# Patient Record
Sex: Female | Born: 1970 | Hispanic: No | Marital: Married | Smoking: Former smoker
Health system: Southern US, Community
[De-identification: ages and names within clinical notes are randomized; demographics above are authoritative.]

## PROBLEM LIST (undated history)

## (undated) DIAGNOSIS — F419 Anxiety disorder, unspecified: Secondary | ICD-10-CM

## (undated) DIAGNOSIS — I4891 Unspecified atrial fibrillation: Secondary | ICD-10-CM

## (undated) DIAGNOSIS — E0500 Thyrotoxicosis with diffuse goiter without thyrotoxic crisis or storm: Secondary | ICD-10-CM

## (undated) DIAGNOSIS — E039 Hypothyroidism, unspecified: Secondary | ICD-10-CM

## (undated) HISTORY — DX: Anxiety disorder, unspecified: F41.9

## (undated) HISTORY — DX: Thyrotoxicosis with diffuse goiter without thyrotoxic crisis or storm: E05.00

## (undated) HISTORY — DX: Hypothyroidism, unspecified: E03.9

## (undated) HISTORY — DX: Unspecified atrial fibrillation: I48.91

## (undated) HISTORY — PX: OTHER SURGICAL HISTORY: SHX169

---

## 1998-04-15 ENCOUNTER — Ambulatory Visit (HOSPITAL_BASED_OUTPATIENT_CLINIC_OR_DEPARTMENT_OTHER): Admission: RE | Admit: 1998-04-15 | Discharge: 1998-04-15 | Payer: Self-pay | Admitting: Orthopedic Surgery

## 2002-06-25 ENCOUNTER — Other Ambulatory Visit: Admission: RE | Admit: 2002-06-25 | Discharge: 2002-06-25 | Payer: Self-pay | Admitting: Obstetrics and Gynecology

## 2003-06-27 ENCOUNTER — Other Ambulatory Visit: Admission: RE | Admit: 2003-06-27 | Discharge: 2003-06-27 | Payer: Self-pay | Admitting: Obstetrics and Gynecology

## 2004-06-29 ENCOUNTER — Other Ambulatory Visit: Admission: RE | Admit: 2004-06-29 | Discharge: 2004-06-29 | Payer: Self-pay | Admitting: Obstetrics and Gynecology

## 2005-07-07 ENCOUNTER — Other Ambulatory Visit: Admission: RE | Admit: 2005-07-07 | Discharge: 2005-07-07 | Payer: Self-pay | Admitting: Obstetrics and Gynecology

## 2009-10-30 ENCOUNTER — Encounter (INDEPENDENT_AMBULATORY_CARE_PROVIDER_SITE_OTHER): Payer: Self-pay | Admitting: *Deleted

## 2009-11-11 ENCOUNTER — Encounter: Admission: RE | Admit: 2009-11-11 | Discharge: 2009-11-11 | Payer: Self-pay | Admitting: Obstetrics and Gynecology

## 2009-12-08 ENCOUNTER — Ambulatory Visit: Payer: Self-pay | Admitting: Gastroenterology

## 2009-12-08 DIAGNOSIS — K5909 Other constipation: Secondary | ICD-10-CM | POA: Insufficient documentation

## 2009-12-08 DIAGNOSIS — K625 Hemorrhage of anus and rectum: Secondary | ICD-10-CM

## 2009-12-18 ENCOUNTER — Ambulatory Visit: Payer: Self-pay | Admitting: Gastroenterology

## 2009-12-24 ENCOUNTER — Encounter: Payer: Self-pay | Admitting: Gastroenterology

## 2010-08-24 NOTE — Procedures (Signed)
Summary: Colonoscopy  Patient: Sonya Arellano Note: All result statuses are Final unless otherwise noted.  Tests: (1) Colonoscopy (COL)   COL Colonoscopy           DONE     [facility]     [address]     [place], [place]  [address]           COLONOSCOPY PROCEDURE REPORT           PATIENT:  Sonya Arellano, Sonya Arellano  MR#:  [id number]     BIRTHDATE:  30-Nov-1970, 38 yrs. old  GENDER:  female           ENDOSCOPIST:  Barbette Hair. Arlyce Dice, MD     Referred by:           PROCEDURE DATE:  12/18/2009     PROCEDURE:  Colonoscopy with biopsy     ASA CLASS:  Class I     INDICATIONS:  1) rectal bleeding           MEDICATIONS:   Fentanyl 75 mcg IV, Versed 8 mg IV, Benadryl 25 mg     IV           DESCRIPTION OF PROCEDURE:   After the risks benefits and     alternatives of the procedure were thoroughly explained, informed     consent was obtained.  Digital rectal exam was performed and     revealed no abnormalities.   The LB CF-[id number] endoscope     was introduced through the anus and advanced to the cecum, which     was identified by both the appendix and ileocecal valve, without     limitations.  The quality of the prep was excellent, using     MoviPrep.  The instrument was then slowly withdrawn as the colon     was fully examined.     <<PROCEDUREIMAGES>>           FINDINGS:  mucosal abnormality. Limited area of submucosal     hemorrhagic mucosa (?secondary to prep vs limited procitis)     extending 2cm from anal canal. Bxs taken (see image7).     Diverticula were found in the ascending colon (see image4).  Mild     diverticulosis was found in the sigmoid colon (see image5).  This     was otherwise a normal examination of the colon (see image1,     image2, image3, and image6).   Retroflexed views in the rectum     revealed no abnormalities.    The time to cecum =  3.50  minutes.     The scope was then withdrawn (time =  5.75  min) from the patient     and the procedure completed.       COMPLICATIONS:  None           ENDOSCOPIC IMPRESSION:     1) Mucosal abnormality     2) Diverticula in the ascending colon     3) Mild diverticulosis in the sigmoid colon     4) Otherwise normal examination     RECOMMENDATIONS:     1) high fiber diet     2) call office next 1-3 days to schedule followup visit in 3     weeks     3) miralax if no bm in 3-4 days           REPEAT EXAM:   You will receive a letter from Dr. Arlyce Dice in 1-2  weeks, after reviewing the final pathology, with followup     recommendations.           ______________________________     Barbette Hair Arlyce Dice, MD           CC: Huel Cote, MD           n.     Rosalie Doctor:   Barbette Hair. Kaplan at 12/18/2009 12:24 PM           Irigoyen, Lafonda Mosses, [id number]  Note: An exclamation mark (!) indicates a result that was not dispersed into the flowsheet. Document Creation Date: 12/18/2009 12:25 PM _______________________________________________________________________  (1) Order result status: Final Collection or observation date-time: 12/18/2009 12:17 Requested date-time:  Receipt date-time:  Reported date-time:  Referring Physician:   Ordering Physician: Melvia Heaps [phone]) Specimen Source:  Source: Launa Grill Order Number: [id number] Lab site:

## 2010-08-24 NOTE — Letter (Signed)
Summary: [facility] Instructions  [facility]  [address] [place], [place] [address]   Phone: [phone]  Fax: [phone]       Sonya Arellano    01-27-1971    MRN: [id number]        Procedure Day /Date:FRIDAY 12/18/2009     Arrival Time:10AM     Procedure Time:11AM     Location of Procedure:                    X   [facility] (4th Floor) _  PREPARATION FOR COLONOSCOPY WITH MOVIPREP   Starting 5 days prior to your procedure 12/13/2009 do not eat nuts, seeds, popcorn, corn, beans, peas,  salads, or any raw vegetables.  Do not take any fiber supplements (e.g. Metamucil, Citrucel, and Benefiber).  THE DAY BEFORE YOUR PROCEDURE         DATE: 12/17/2009  FUX:NATFTDDU 1.  Drink clear liquids the entire day-NO SOLID FOOD  2.  Do not drink anything colored red or purple.  Avoid juices with pulp.  No orange juice.  3.  Drink at least 64 oz. (8 glasses) of fluid/clear liquids during the day to prevent dehydration and help the prep work efficiently.  CLEAR LIQUIDS INCLUDE: Water Jello Ice Popsicles Tea (sugar ok, no milk/cream) Powdered fruit flavored drinks Coffee (sugar ok, no milk/cream) Gatorade Juice: apple, white grape, white cranberry  Lemonade Clear bullion, consomm, broth Carbonated beverages (any kind) Strained chicken noodle soup Hard Candy                             4.  In the morning, mix first dose of MoviPrep solution:    Empty 1 Pouch A and 1 Pouch B into the disposable container    Add lukewarm drinking water to the top line of the container. Mix to dissolve    Refrigerate (mixed solution should be used within 24 hrs)  5.  Begin drinking the prep at 5:00 p.m. The MoviPrep container is divided by 4 marks.   Every 15 minutes drink the solution down to the next mark (approximately 8 oz) until the full liter is complete.   6.  Follow completed prep with 16 oz of clear liquid of your choice (Nothing red or purple).  Continue to  drink clear liquids until bedtime.  7.  Before going to bed, mix second dose of MoviPrep solution:    Empty 1 Pouch A and 1 Pouch B into the disposable container    Add lukewarm drinking water to the top line of the container. Mix to dissolve    Refrigerate  THE DAY OF YOUR PROCEDURE      DATE: 12/18/2009 DAY: FRIDAY  Beginning at 6a.m. (5 hours before procedure):         1. Every 15 minutes, drink the solution down to the next mark (approx 8 oz) until the full liter is complete.  2. Follow completed prep with 16 oz. of clear liquid of your choice.    3. You may drink clear liquids until 9AM(2 HOURS BEFORE PROCEDURE).   MEDICATION INSTRUCTIONS  Unless otherwise instructed, you should take regular prescription medications with a small sip of water   as early as possible the morning of your procedure.          OTHER INSTRUCTIONS  You will need a responsible adult at least 42 years of age to accompany you and drive  you home.   This person must remain in the waiting room during your procedure.  Wear loose fitting clothing that is easily removed.  Leave jewelry and other valuables at home.  However, you may wish to bring a book to read or  an iPod/MP3 player to listen to music as you wait for your procedure to start.  Remove all body piercing jewelry and leave at home.  Total time from sign-in until discharge is approximately 2-3 hours.  You should go home directly after your procedure and rest.  You can resume normal activities the  day after your procedure.  The day of your procedure you should not:   Drive   Make legal decisions   Operate machinery   Drink alcohol   Return to work  You will receive specific instructions about eating, activities and medications before you leave.    The above instructions have been reviewed and explained to me by   _______________________    I fully understand and can verbalize these instructions  _____________________________ Date _________

## 2010-08-24 NOTE — Assessment & Plan Note (Signed)
Summary: rectal bleeding--ch.   History of Present Illness Visit Type: consult Primary GI MD: Melvia Heaps MD [facility] Primary Provider: n/a Requesting Provider: Huel Cote, MD Chief Complaint: episode of bowel movement with mucous and blood, pt has also noticed change in bowels History of Present Illness:   Sonya Arellano is a pleasant 40 year old white female referred at the request of Dr. Senaida Ores for evaluation of constipation and rectal bleeding.  She has had constipation for years whereby she will may move her bowels once a week.  Over the past year she developed abdominal bloating, discomfort and occasional pain associated with constipation.  At the same time she has noted decreased caliber of her stools and passage of blood and mucus along with her stools.  The latter has occurred on a couple of occasions.  Weight has been stable.   GI Review of Systems    Reports abdominal pain, bloating, loss of appetite, and  nausea.     Location of  Abdominal pain: upper abdomen.    Denies acid reflux, belching, chest pain, dysphagia with liquids, dysphagia with solids, heartburn, vomiting, vomiting blood, weight loss, and  weight gain.      Reports change in bowel habits, constipation, diarrhea, and  irritable bowel syndrome.     Denies anal fissure, black tarry stools, diverticulosis, fecal incontinence, heme positive stool, hemorrhoids, jaundice, light color stool, liver problems, rectal bleeding, and  rectal pain. Preventive Screening-Counseling & Management  Alcohol-Tobacco     Smoking Status: never      Drug Use:  no.      Current Medications (verified): 1)  None  Allergies (verified): 1)  ! Sulfa  Past History:  Past Medical History: Pelvic Endometriosis Migraines  Anxiety Disorder Urinary Tract Infection  Past Surgical History: Reviewed history from 12/03/2009 and no changes required. C-Section Cryosurgery Laparoscopy  Family History: CVA Family History of  Diabetes: Mat Uncle Family History of Heart Disease: Father-MI, Grandparents (3 deceased), Uncle-deceased Family History of Breast Cancer: MGM No FH of Colon Cancer:  Social History: Married, 1 girl Preschool Education Patient has never smoked.  Alcohol Use - yes-weekends 2 drinks Illicit Drug Use - no Smoking Status:  never Drug Use:  no  Review of Systems  The patient denies allergy/sinus, anemia, anxiety-new, arthritis/joint pain, back pain, blood in urine, breast changes/lumps, confusion, cough, coughing up blood, depression-new, fainting, fatigue, fever, headaches-new, hearing problems, heart murmur, heart rhythm changes, itching, menstrual pain, muscle pains/cramps, night sweats, nosebleeds, pregnancy symptoms, shortness of breath, skin rash, sleeping problems, sore throat, swelling of feet/legs, swollen lymph glands, thirst - excessive, urination - excessive, urination changes/pain, urine leakage, vision changes, and voice change.    Vital Signs:  Patient profile:   40 year old female Height:      65 inches Weight:      140 pounds BMI:     23.38 Pulse rate:   56 / minute Pulse rhythm:   regular BP sitting:   114 / 70  (left arm) Cuff size:   regular  Vitals Entered By: Francee Piccolo CMA Duncan Dull) (Dec 08, 2009 3:22 PM)  Physical Exam  Additional Exam:  On physical exam she is a healthy-appearing female  skin: anicteric HEENT: normocephalic; PEERLA; no nasal or pharyngeal abnormalities neck: supple nodes: no cervical lymphadenopathy chest: clear to ausculatation and percussion heart: no murmurs, gallops, or rubs abd: soft, nontender; BS normoactive; no abdominal masses, tenderness, organomegaly rectal: deferred ext: no cynanosis, clubbing, edema skeletal: no deformities neuro: oriented x 3;  no focal abnormalities    Impression & Recommendations:  Problem # 1:  OTHER CONSTIPATION (ICD-564.09)  Symptoms seem to be worsening as evidenced by abdominal  discomfort with constipation.  This is most likely functional constipation though structural upper maleate the colon including stomach or bowel disease should be ruled out.  Recommendations #1 colonoscopy #2 if colonoscopy is negative will work on a bowel regimen with her  Risks, alternatives, and complications of the procedure, including bleeding, perforation, and possible need for surgery, were explained to the patient.  Patient's questions were answered.  Orders: Colonoscopy (Colon)  Problem # 2:  RECTAL BLEEDING (ICD-569.3)  Bleeding may be hemorrhoidal.  A more proximal colonic bleeding source should be ruled out.  Recommendations #1 colonoscopy  Orders: Colonoscopy (Colon)  Patient Instructions: 1)  Colonoscopy and Flexible Sigmoidoscopy brochure given.  2)  Conscious Sedation brochure given.  3)  Your Colonoscopy is scheduled for 12/18/2009 at 11am 4)  You can pick up your MoviPrep from your pharmacy today 5)  CC Dr. Huel Cote 6)  The medication list was reviewed and reconciled.  All changed / newly prescribed medications were explained.  A complete medication list was provided to the patient / caregiver. Prescriptions: MOVIPREP 100 GM  SOLR (PEG-KCL-NACL-NASULF-NA ASC-C) As per prep instructions.  #1 x 0   Entered by:   Merri Ray CMA (AAMA)   Authorized by:   Louis Meckel MD   Signed by:   Merri Ray CMA (AAMA) on 12/08/2009   Method used:   Electronically to        [facility]  [address] [id number] ([facility]       [address]       [place], [place]  [address]       Ph: [phone] or [phone]       Fax: [phone]   RxID:   [id number]

## 2010-08-24 NOTE — Letter (Signed)
Summary: Results Letter  [facility]  [address] [place], [place] [address]   Phone: [phone]  Fax: [phone]        December 24, 2009 MRN: [id number]    [facility] [place] [address] [place], [place]  [address]    Dear Ms. Eastwood,   Your biopsies demonstrated mild inflammatory changes only.    Please follow the recommendations previously discussed.  Should you have any immediate concerns or questions, feel free to contact me at the office.    Sincerely,  Barbette Hair. Arlyce Dice, M.D., [facility]          Sincerely,  Louis Meckel MD  This letter has been electronically signed by your physician.  Appended Document: Results Letter letter mailed.

## 2010-08-24 NOTE — Letter (Signed)
Summary: Results Letter  [facility]  [address] [place], [place] [address]   Phone: [phone]  Fax: [phone]        Dec 08, 2009 MRN: [id number]    [facility] [place] [address] [place], [place]  [address]    Dear Ms. Asquith,  It is my pleasure to have treated you recently as a new patient in my office. I appreciate your confidence and the opportunity to participate in your care.  Since I do have a busy inpatient endoscopy schedule and office schedule, my office hours vary weekly. I am, however, available for emergency calls everyday through my office. If I am not available for an urgent office appointment, another one of our gastroenterologist will be able to assist you.  My well-trained staff are prepared to help you at all times. For emergencies after office hours, a physician from our Gastroenterology section is always available through my 24 hour answering service  Once again I welcome you as a new patient and I look forward to a happy and healthy relationship             Sincerely,  Louis Meckel MD  This letter has been electronically signed by your physician.  Appended Document: Results Letter LETTER MAILED

## 2010-08-24 NOTE — Letter (Signed)
Summary: New Patient letter  [facility]  [address] [place], [place] [address]   Phone: [phone]  Fax: [phone]       10/30/2009 MRN: [id number]  Sonya Arellano [address] [place], [place]  [address]  Dear Ms. Gobert,  Welcome to the Gastroenterology Division at [facility].    You are scheduled to see Dr. Arlyce Dice on 11-30-09 at 3:30a.m. on the 3rd floor at [facility], [address]  We ask that you try to arrive at our office 15 minutes prior to your appointment time to allow for check-in.  We would like you to complete the enclosed self-administered evaluation form prior to your visit and bring it with you on the day of your appointment.  We will review it with you.  Also, please bring a complete list of all your medications or, if you prefer, bring the medication bottles and we will list them.  Please bring your insurance card so that we may make a copy of it.  If your insurance requires a referral to see a specialist, please bring your referral form from your primary care physician.  Co-payments are due at the time of your visit and may be paid by cash, check or credit card.     Your office visit will consist of a consult with your physician (includes a physical exam), any laboratory testing he/she may order, scheduling of any necessary diagnostic testing (e.g. x-ray, ultrasound, CT-scan), and scheduling of a procedure (e.g. Endoscopy, Colonoscopy) if required.  Please allow enough time on your schedule to allow for any/all of these possibilities.    If you cannot keep your appointment, please call [phone] to cancel or reschedule prior to your appointment date.  This allows [place] the opportunity to schedule an appointment for another patient in need of care.  If you do not cancel or reschedule by 5 p.m. the business day prior to your appointment date, you will be charged a $50.00 late cancellation/no-show fee.    Thank you for choosing  [facility] for your medical needs.  We appreciate the opportunity to care for you.  Please visit [place] at our website  to learn more about our practice.                     Sincerely,                                                             The Gastroenterology Division

## 2014-12-29 ENCOUNTER — Other Ambulatory Visit (HOSPITAL_COMMUNITY): Payer: Self-pay | Admitting: Internal Medicine

## 2014-12-29 DIAGNOSIS — E0590 Thyrotoxicosis, unspecified without thyrotoxic crisis or storm: Secondary | ICD-10-CM

## 2015-01-12 ENCOUNTER — Encounter (HOSPITAL_COMMUNITY)
Admission: RE | Admit: 2015-01-12 | Discharge: 2015-01-12 | Disposition: A | Discharge status: Home / Self Care | Payer: 59 | Source: Ambulatory Visit | Attending: Internal Medicine | Admitting: Internal Medicine

## 2015-01-12 DIAGNOSIS — E0590 Thyrotoxicosis, unspecified without thyrotoxic crisis or storm: Secondary | ICD-10-CM

## 2015-01-12 DIAGNOSIS — E0500 Thyrotoxicosis with diffuse goiter without thyrotoxic crisis or storm: Secondary | ICD-10-CM | POA: Insufficient documentation

## 2015-01-12 DIAGNOSIS — E213 Hyperparathyroidism, unspecified: Secondary | ICD-10-CM | POA: Insufficient documentation

## 2015-01-13 ENCOUNTER — Encounter (HOSPITAL_COMMUNITY)
Admission: RE | Admit: 2015-01-13 | Discharge: 2015-01-13 | Disposition: A | Discharge status: Home / Self Care | Payer: 59 | Source: Ambulatory Visit | Attending: Internal Medicine | Admitting: Internal Medicine

## 2015-01-13 DIAGNOSIS — E213 Hyperparathyroidism, unspecified: Secondary | ICD-10-CM | POA: Diagnosis not present

## 2015-01-13 DIAGNOSIS — E0500 Thyrotoxicosis with diffuse goiter without thyrotoxic crisis or storm: Secondary | ICD-10-CM | POA: Diagnosis not present

## 2015-01-13 MED ORDER — SODIUM PERTECHNETATE TC 99M INJECTION: 11.0000 | Freq: Once | INTRAVENOUS | Status: AC | PRN

## 2015-01-13 MED ORDER — SODIUM IODIDE I 131 CAPSULE: 7.4000 | Freq: Once | INTRAVENOUS | Status: AC | PRN

## 2015-01-30 ENCOUNTER — Ambulatory Visit (HOSPITAL_COMMUNITY)
Admission: RE | Admit: 2015-01-30 | Discharge: 2015-01-30 | Disposition: A | Discharge status: Home / Self Care | Payer: 59 | Source: Ambulatory Visit | Attending: Internal Medicine | Admitting: Internal Medicine

## 2015-01-30 DIAGNOSIS — E0500 Thyrotoxicosis with diffuse goiter without thyrotoxic crisis or storm: Secondary | ICD-10-CM | POA: Insufficient documentation

## 2015-01-30 DIAGNOSIS — E0590 Thyrotoxicosis, unspecified without thyrotoxic crisis or storm: Secondary | ICD-10-CM

## 2015-01-30 LAB — HCG, SERUM, QUALITATIVE: PREG SERUM: NEGATIVE

## 2015-01-30 MED ORDER — SODIUM IODIDE I 131 CAPSULE
12.5600 | Freq: Once | INTRAVENOUS | Status: AC | PRN
  Administered 2015-01-30: 12.56 via ORAL

## 2015-08-12 ENCOUNTER — Other Ambulatory Visit: Payer: Self-pay | Admitting: Obstetrics and Gynecology

## 2015-08-12 DIAGNOSIS — R928 Other abnormal and inconclusive findings on diagnostic imaging of breast: Secondary | ICD-10-CM

## 2015-08-14 ENCOUNTER — Other Ambulatory Visit: Payer: Self-pay | Admitting: Obstetrics and Gynecology

## 2015-08-14 ENCOUNTER — Other Ambulatory Visit: Payer: Self-pay

## 2015-08-14 DIAGNOSIS — R928 Other abnormal and inconclusive findings on diagnostic imaging of breast: Secondary | ICD-10-CM

## 2015-08-18 ENCOUNTER — Ambulatory Visit
Admission: RE | Admit: 2015-08-18 | Discharge: 2015-08-18 | Disposition: A | Discharge status: Home / Self Care | Payer: 59 | Source: Ambulatory Visit | Attending: Obstetrics and Gynecology | Admitting: Obstetrics and Gynecology

## 2015-08-18 ENCOUNTER — Other Ambulatory Visit: Payer: Self-pay | Admitting: Obstetrics and Gynecology

## 2015-08-18 DIAGNOSIS — R928 Other abnormal and inconclusive findings on diagnostic imaging of breast: Secondary | ICD-10-CM

## 2015-08-24 ENCOUNTER — Encounter: Payer: Self-pay | Admitting: Gastroenterology

## 2018-02-21 DIAGNOSIS — E039 Hypothyroidism, unspecified: Secondary | ICD-10-CM

## 2018-02-21 DIAGNOSIS — R002 Palpitations: Secondary | ICD-10-CM

## 2018-02-21 DIAGNOSIS — E890 Postprocedural hypothyroidism: Secondary | ICD-10-CM

## 2018-02-21 DIAGNOSIS — I4891 Unspecified atrial fibrillation: Secondary | ICD-10-CM

## 2018-02-21 DIAGNOSIS — F419 Anxiety disorder, unspecified: Secondary | ICD-10-CM | POA: Insufficient documentation

## 2018-02-21 HISTORY — DX: Anxiety disorder, unspecified: F41.9

## 2018-02-21 HISTORY — DX: Hypothyroidism, unspecified: E03.9

## 2018-02-21 HISTORY — DX: Unspecified atrial fibrillation: I48.91

## 2018-02-23 ENCOUNTER — Encounter: Payer: Self-pay | Admitting: Cardiology

## 2018-02-23 ENCOUNTER — Ambulatory Visit: Payer: 59 | Admitting: Cardiology

## 2018-02-23 VITALS — BP 140/82 | HR 88 | Ht 65.0 in | Wt 175.0 lb

## 2018-02-23 DIAGNOSIS — F419 Anxiety disorder, unspecified: Secondary | ICD-10-CM | POA: Diagnosis not present

## 2018-02-23 DIAGNOSIS — I480 Paroxysmal atrial fibrillation: Secondary | ICD-10-CM

## 2018-02-23 DIAGNOSIS — R002 Palpitations: Secondary | ICD-10-CM

## 2018-02-23 DIAGNOSIS — E890 Postprocedural hypothyroidism: Secondary | ICD-10-CM | POA: Diagnosis not present

## 2018-02-23 MED ORDER — METOPROLOL SUCCINATE ER 25 MG PO TB24: 25.0000 mg | ORAL_TABLET | Freq: Every day | ORAL | 3 refills | Status: DC

## 2018-02-23 NOTE — Progress Notes (Signed)
Cardiology Consultation:    Date:  02/23/2018   ID:  Sonya Arellano, DOB May 24, 1971, MRN [id number]  PCP:  Marylen Ponto, MD  Cardiologist:  Gypsy Balsam, MD   Referring MD: Marylen Ponto, MD   Chief Complaint  Patient presents with  . New Patient (Initial Visit)    some swelling in her legs   I have palpitations  History of Present Illness:    Sonya Arellano is a 47 y.o. female who is being seen today for the evaluation of palpitations at the request of Marylen Ponto, MD.  4 years ago she had diagnosis of Graves' disease after that she got ablation therapy and is doing well from that point review.  She did have episode of atrial fibrillation while she was having hyperthyroidism.  Recently she started experiencing palpitations again she described fast heartbeats she said any effort will bring her heart rate up.  It is uncomfortable for her and make her very worried also described to have some irregular heartbeat but does happen rarely she did wear her Zio patch which showed normal sinus rhythm with average heart rate within 24 hours of 85.  She did have episodes of supraventricular tachycardia but only 4 beats in a row.  She like to exercise however she is afraid to exercise now she walks every single day.  No chest pain tightness squeezing pressure burning chest.  She does have diagnosis of anxiety and she overall consider herself as an anxious person.  She had difficulty sleeping and falling asleep.  But when she falls asleep she sleeps fine.  She does have family history of premature coronary artery disease and she is very concerned about it.  Past Medical History:  Diagnosis Date  . Anxiety 02/21/2018  . Atrial fibrillation (HCC) 02/21/2018  . Graves' disease   . Hypothyroidism 02/21/2018  . Toxic goiter       Current Medications: Current Meds  Medication Sig  . atorvastatin (LIPITOR) 10 MG tablet Take 10 mg by mouth daily.  Marland Kitchen levothyroxine (SYNTHROID, LEVOTHROID) 75 MCG tablet  Take 1 tablet by mouth daily.  Marland Kitchen LORazepam (ATIVAN) 0.5 MG tablet Take 1 tablet by mouth as needed.  . vortioxetine HBr (TRINTELLIX) 20 MG TABS tablet Take 20 mg by mouth daily.      Allergies:   Sulfonamide derivatives   Social History   Socioeconomic History  . Marital status: Married    Spouse name: Not on file  . Number of children: Not on file  . Years of education: Not on file  . Highest education level: Not on file  Occupational History  . Not on file  Social Needs  . Financial resource strain: Not on file  . Food insecurity:    Worry: Not on file    Inability: Not on file  . Transportation needs:    Medical: Not on file    Non-medical: Not on file  Tobacco Use  . Smoking status: Former Games developer  . Smokeless tobacco: Never Used  Substance and Sexual Activity  . Alcohol use: Yes    Alcohol/week: 0.6 - 1.2 oz    Types: 1 - 2 Cans of beer per week    Comment: 2-3 x per week  . Drug use: Not on file  . Sexual activity: Not on file  Lifestyle  . Physical activity:    Days per week: Not on file    Minutes per session: Not on file  . Stress: Not on file  Relationships  . Social connections:    Talks on phone: Not on file    Gets together: Not on file    Attends religious service: Not on file    Active member of club or organization: Not on file    Attends meetings of clubs or organizations: Not on file    Relationship status: Not on file  Other Topics Concern  . Not on file  Social History Narrative  . Not on file     Family History: The patient's family history includes Aneurysm in her father; Breast cancer in her maternal grandmother; Heart disease in her father, maternal grandfather, paternal grandfather, and paternal grandmother; Osteoarthritis in her mother; Osteoporosis in her mother; Stroke in her maternal grandmother. ROS:   Please see the history of present illness.    All 14 point review of systems negative except as described per history of present  illness.  EKGs/Labs/Other Studies Reviewed:    The following studies were reviewed today:   EKG:  EKG is  ordered today.  The ekg ordered today demonstrates normal sinus rhythm rate 87 normal P interval normal QS complex duration morphology no ST-T segment changes  Recent Labs: No results found for requested labs within last 8760 hours.  Recent Lipid Panel No results found for: CHOL, TRIG, HDL, CHOLHDL, VLDL, LDLCALC, LDLDIRECT  Physical Exam:    VS:  BP 140/82 (BP Location: Left Arm, Patient Position: Sitting)   Pulse 88   Ht 5\' 5"  (1.651 m)   Wt 175 lb (79.4 kg)   LMP 02/13/2018   SpO2 98%   BMI 29.12 kg/m     Wt Readings from Last 3 Encounters:  02/23/18 175 lb (79.4 kg)     GEN:  Well nourished, well developed in no acute distress HEENT: Normal NECK: No JVD; No carotid bruits LYMPHATICS: No lymphadenopathy CARDIAC: RRR, no murmurs, no rubs, no gallops RESPIRATORY:  Clear to auscultation without rales, wheezing or rhonchi  ABDOMEN: Soft, non-tender, non-distended MUSCULOSKELETAL:  No edema; No deformity  SKIN: Warm and dry NEUROLOGIC:  Alert and oriented x 3 PSYCHIATRIC:  Normal affect   ASSESSMENT:    1. Paroxysmal atrial fibrillation (HCC)   2. Postoperative hypothyroidism   3. Palpitations   4. Anxiety    PLAN:    In order of problems listed above:  1. Paroxysmal atrial fibrillation.  The only one documented episode years ago when she had hyperthyroidism repeated monitor she were did not show any atrial fibrillation just previous of sinus tachycardia. 2. Palpitations: I suspect she does have sinus tachycardia I will ask her to have an echocardiogram to assess her heart structurally and I will also give her a prescription for metoprolol succinate 25 mg daily. 3. Anxiety: That being managed by primary care physician.  She is doing better from that point review.  I told her if her echocardiogram will be normal she needs to start exercising on the regular  basis.  That should help with her heart rate.  Also told her that I doubt very much that she does have some significant arrhythmia was being proven by event recorder zio patch.  I see her back in my office in about a month or sooner if she get a problem   Medication Adjustments/Labs and Tests Ordered: Current medicines are reviewed at length with the patient today.  Concerns regarding medicines are outlined above.  No orders of the defined types were placed in this encounter.  No orders of the defined types  were placed in this encounter.   Signed, Georgeanna Leaobert J. Krasowski, MD, [facility]. 02/23/2018 2:52 PM    [facility]

## 2018-02-23 NOTE — Addendum Note (Signed)
Addended by: Crist FatLOCKHART, CATHERINE P on: 02/23/2018 03:02 PM   Modules accepted: Orders

## 2018-02-23 NOTE — Patient Instructions (Signed)
Medication Instructions:  Your physician has recommended you make the following change in your medication:  START metoprolol succinate 25 mg daily   Labwork: None  Testing/Procedures: You had an EKG today.   Your physician has requested that you have an echocardiogram. Echocardiography is a painless test that uses sound waves to create images of your heart. It provides your doctor with information about the size and shape of your heart and how well your heart's chambers and valves are working. This procedure takes approximately one hour. There are no restrictions for this procedure.  Follow-Up: Your physician recommends that you schedule a follow-up appointment in: 1 month.   If you need a refill on your cardiac medications before your next appointment, please call your pharmacy.   Thank you for choosing [facility]! Mady Gemma, RN [phone]    Echocardiogram An echocardiogram, or echocardiography, uses sound waves (ultrasound) to produce an image of your heart. The echocardiogram is simple, painless, obtained within a short period of time, and offers valuable information to your health care provider. The images from an echocardiogram can provide information such as:  Evidence of coronary artery disease (CAD).  Heart size.  Heart muscle function.  Heart valve function.  Aneurysm detection.  Evidence of a past heart attack.  Fluid buildup around the heart.  Heart muscle thickening.  Assess heart valve function.  Tell a health care provider about:  Any allergies you have.  All medicines you are taking, including vitamins, herbs, eye drops, creams, and over-the-counter medicines.  Any problems you or family members have had with anesthetic medicines.  Any blood disorders you have.  Any surgeries you have had.  Any medical conditions you have.  Whether you are pregnant or may be pregnant. What happens before the procedure? No special preparation  is needed. Eat and drink normally. What happens during the procedure?  In order to produce an image of your heart, gel will be applied to your chest and a wand-like tool (transducer) will be moved over your chest. The gel will help transmit the sound waves from the transducer. The sound waves will harmlessly bounce off your heart to allow the heart images to be captured in real-time motion. These images will then be recorded.  You may need an IV to receive a medicine that improves the quality of the pictures. What happens after the procedure? You may return to your normal schedule including diet, activities, and medicines, unless your health care provider tells you otherwise. This information is not intended to replace advice given to you by your health care provider. Make sure you discuss any questions you have with your health care provider. Document Released: 07/08/2000 Document Revised: 02/27/2016 Document Reviewed: 03/18/2013 Elsevier Interactive Patient Education  2017 Elsevier Inc.   Metoprolol extended-release tablets What is this medicine? METOPROLOL (me TOE proe lole) is a beta-blocker. Beta-blockers reduce the workload on the heart and help it to beat more regularly. This medicine is used to treat high blood pressure and to prevent chest pain. It is also used to after a heart attack and to prevent an additional heart attack from occurring. This medicine may be used for other purposes; ask your health care provider or pharmacist if you have questions. COMMON BRAND NAME(S): toprol, Toprol XL What should I tell my health care provider before I take this medicine? They need to know if you have any of these conditions: -diabetes -heart or vessel disease like slow heart rate, worsening heart failure, heart block, sick  sinus syndrome or Raynaud's disease -kidney disease -liver disease -lung or breathing disease, like asthma or emphysema -pheochromocytoma -thyroid disease -an unusual  or allergic reaction to metoprolol, other beta-blockers, medicines, foods, dyes, or preservatives -pregnant or trying to get pregnant -breast-feeding How should I use this medicine? Take this medicine by mouth with a glass of water. Follow the directions on the prescription label. Do not crush or chew. Take this medicine with or immediately after meals. Take your doses at regular intervals. Do not take more medicine than directed. Do not stop taking this medicine suddenly. This could lead to serious heart-related effects. Talk to your pediatrician regarding the use of this medicine in children. While this drug may be prescribed for children as young as 6 years for selected conditions, precautions do apply. Overdosage: If you think you have taken too much of this medicine contact a poison control center or emergency room at once. NOTE: This medicine is only for you. Do not share this medicine with others. What if I miss a dose? If you miss a dose, take it as soon as you can. If it is almost time for your next dose, take only that dose. Do not take double or extra doses. What may interact with this medicine? This medicine may interact with the following medications: -certain medicines for blood pressure, heart disease, irregular heart beat -certain medicines for depression, like monoamine oxidase (MAO) inhibitors, fluoxetine, or paroxetine -clonidine -dobutamine -epinephrine -isoproterenol -reserpine This list may not describe all possible interactions. Give your health care provider a list of all the medicines, herbs, non-prescription drugs, or dietary supplements you use. Also tell them if you smoke, drink alcohol, or use illegal drugs. Some items may interact with your medicine. What should I watch for while using this medicine? Visit your doctor or health care professional for regular check ups. Contact your doctor right away if your symptoms worsen. Check your blood pressure and pulse rate  regularly. Ask your health care professional what your blood pressure and pulse rate should be, and when you should contact them. You may get drowsy or dizzy. Do not drive, use machinery, or do anything that needs mental alertness until you know how this medicine affects you. Do not sit or stand up quickly, especially if you are an older patient. This reduces the risk of dizzy or fainting spells. Contact your doctor if these symptoms continue. Alcohol may interfere with the effect of this medicine. Avoid alcoholic drinks. What side effects may I notice from receiving this medicine? Side effects that you should report to your doctor or health care professional as soon as possible: -allergic reactions like skin rash, itching or hives -cold or numb hands or feet -depression -difficulty breathing -faint -fever with sore throat -irregular heartbeat, chest pain -rapid weight gain -swollen legs or ankles Side effects that usually do not require medical attention (report to your doctor or health care professional if they continue or are bothersome): -anxiety or nervousness -change in sex drive or performance -dry skin -headache -nightmares or trouble sleeping -short term memory loss -stomach upset or diarrhea -unusually tired This list may not describe all possible side effects. Call your doctor for medical advice about side effects. You may report side effects to FDA at 1-800-FDA-1088. Where should I keep my medicine? Keep out of the reach of children. Store at room temperature between 15 and 30 degrees C (59 and 86 degrees F). Throw away any unused medicine after the expiration date. NOTE: This sheet is  a summary. It may not cover all possible information. If you have questions about this medicine, talk to your doctor, pharmacist, or health care provider.  2018 Elsevier/Gold Standard (2013-03-15 14:41:37)

## 2018-03-01 ENCOUNTER — Encounter (HOSPITAL_BASED_OUTPATIENT_CLINIC_OR_DEPARTMENT_OTHER): Payer: Self-pay

## 2018-03-02 ENCOUNTER — Ambulatory Visit (HOSPITAL_BASED_OUTPATIENT_CLINIC_OR_DEPARTMENT_OTHER)
Admission: RE | Admit: 2018-03-02 | Discharge: 2018-03-02 | Disposition: A | Discharge status: Home / Self Care | Payer: 59 | Source: Ambulatory Visit | Attending: Cardiology | Admitting: Cardiology

## 2018-03-02 DIAGNOSIS — R002 Palpitations: Secondary | ICD-10-CM

## 2018-03-02 DIAGNOSIS — E785 Hyperlipidemia, unspecified: Secondary | ICD-10-CM | POA: Diagnosis not present

## 2018-03-02 DIAGNOSIS — I480 Paroxysmal atrial fibrillation: Secondary | ICD-10-CM

## 2018-03-02 NOTE — Progress Notes (Signed)
  Echocardiogram 2D Echocardiogram has been performed.  Yony Roulston T Glover Capano 03/02/2018, 12:08 PM

## 2018-03-23 DIAGNOSIS — E0500 Thyrotoxicosis with diffuse goiter without thyrotoxic crisis or storm: Secondary | ICD-10-CM | POA: Insufficient documentation

## 2018-03-23 DIAGNOSIS — N907 Vulvar cyst: Secondary | ICD-10-CM | POA: Insufficient documentation

## 2018-03-23 DIAGNOSIS — N920 Excessive and frequent menstruation with regular cycle: Secondary | ICD-10-CM | POA: Insufficient documentation

## 2018-04-02 ENCOUNTER — Encounter: Payer: Self-pay | Admitting: Cardiology

## 2018-04-02 ENCOUNTER — Ambulatory Visit: Payer: 59 | Admitting: Cardiology

## 2018-04-02 VITALS — BP 108/62 | HR 66 | Ht 65.0 in | Wt 173.4 lb

## 2018-04-02 DIAGNOSIS — I480 Paroxysmal atrial fibrillation: Secondary | ICD-10-CM | POA: Diagnosis not present

## 2018-04-02 DIAGNOSIS — F419 Anxiety disorder, unspecified: Secondary | ICD-10-CM | POA: Diagnosis not present

## 2018-04-02 DIAGNOSIS — R002 Palpitations: Secondary | ICD-10-CM | POA: Diagnosis not present

## 2018-04-02 MED ORDER — METOPROLOL SUCCINATE ER 50 MG PO TB24: 50.0000 mg | ORAL_TABLET | Freq: Every day | ORAL | 3 refills | Status: AC

## 2018-04-02 NOTE — Patient Instructions (Addendum)
Medication Instructions:  Your physician has recommended you make the following change in your medication:   INCREASE: Metoprolol succinate to 50 mg daily.   Labwork: None.  Testing/Procedures: None.  Follow-Up: Your physician wants you to follow-up in: 3 months.  You will receive a reminder letter in the mail two months in advance. If you don't receive a letter, please call our office to schedule the follow-up appointment.   Any Other Special Instructions Will Be Listed Below (If Applicable).     If you need a refill on your cardiac medications before your next appointment, please call your pharmacy.

## 2018-04-02 NOTE — Progress Notes (Signed)
Cardiology Office Note:    Date:  04/02/2018   ID:  Sonya Arellano, DOB 1971-02-15, MRN [id number]  PCP:  Sonya Ponto, MD  Cardiologist:  Gypsy Balsam, MD    Referring MD: Sonya Ponto, MD   Chief Complaint  Patient presents with  . Follow-up    1 month follow up   Palpitations  History of Present Illness:    Sonya Arellano is a 47 y.o. female with palpitations who was sent to me for evaluation since when she had Graves' disease she ended up having atrial fibrillation that was only one documented episode of atrial fibrillation since that time there is no atrial fibrillation she wear long-term monitor which shows some extrasystole as well as nonsustained supraventricular tachycardia but no atrial fibrillation.  She was given small dose of beta-blocker only 25 of Toprol-XL which seems to be helping still does have episodes of arrhythmia happening maybe once a week.  Past Medical History:  Diagnosis Date  . Anxiety 02/21/2018  . Atrial fibrillation (HCC) 02/21/2018  . Graves' disease   . Hypothyroidism 02/21/2018  . Toxic goiter     Past Surgical History:  Procedure Laterality Date  . CESAREAN SECTION    . Radioactive Iodine Ablation      Current Medications: Current Meds  Medication Sig  . atorvastatin (LIPITOR) 10 MG tablet Take 10 mg by mouth daily.  Marland Kitchen levothyroxine (SYNTHROID, LEVOTHROID) 75 MCG tablet Take 1 tablet by mouth daily.  Marland Kitchen LORazepam (ATIVAN) 0.5 MG tablet Take 1 tablet by mouth as needed.  . metoprolol succinate (TOPROL-XL) 25 MG 24 hr tablet Take 1 tablet (25 mg total) by mouth daily.  Marland Kitchen vortioxetine HBr (TRINTELLIX) 20 MG TABS tablet Take 20 mg by mouth daily.      Allergies:   Sulfa antibiotics; Sulfonamide derivatives; and Ketoconazole   Social History   Socioeconomic History  . Marital status: Married    Spouse name: Not on file  . Number of children: Not on file  . Years of education: Not on file  . Highest education level: Not on file    Occupational History  . Not on file  Social Needs  . Financial resource strain: Not on file  . Food insecurity:    Worry: Not on file    Inability: Not on file  . Transportation needs:    Medical: Not on file    Non-medical: Not on file  Tobacco Use  . Smoking status: Former Games developer  . Smokeless tobacco: Never Used  Substance and Sexual Activity  . Alcohol use: Yes    Alcohol/week: 1.0 - 2.0 standard drinks    Types: 1 - 2 Cans of beer per week    Comment: 2-3 x per week  . Drug use: Not on file  . Sexual activity: Not on file  Lifestyle  . Physical activity:    Days per week: Not on file    Minutes per session: Not on file  . Stress: Not on file  Relationships  . Social connections:    Talks on phone: Not on file    Gets together: Not on file    Attends religious service: Not on file    Active member of club or organization: Not on file    Attends meetings of clubs or organizations: Not on file    Relationship status: Not on file  Other Topics Concern  . Not on file  Social History Narrative  . Not on file  Family History: The patient's family history includes Aneurysm in her father; Breast cancer in her maternal grandmother; Heart disease in her father, maternal grandfather, paternal grandfather, and paternal grandmother; Osteoarthritis in her mother; Osteoporosis in her mother; Stroke in her maternal grandmother. ROS:   Please see the history of present illness.    All 14 point review of systems negative except as described per history of present illness  EKGs/Labs/Other Studies Reviewed:      Recent Labs: No results found for requested labs within last 8760 hours.  Recent Lipid Panel No results found for: CHOL, TRIG, HDL, CHOLHDL, VLDL, LDLCALC, LDLDIRECT  Physical Exam:    VS:  BP 108/62   Pulse 66   Ht 5\' 5"  (1.651 m)   Wt 173 lb 6.4 oz (78.7 kg)   LMP 03/12/2018   BMI 28.86 kg/m     Wt Readings from Last 3 Encounters:  04/02/18 173 lb 6.4 oz  (78.7 kg)  02/23/18 175 lb (79.4 kg)     GEN:  Well nourished, well developed in no acute distress HEENT: Normal NECK: No JVD; No carotid bruits LYMPHATICS: No lymphadenopathy CARDIAC: RRR, no murmurs, no rubs, no gallops RESPIRATORY:  Clear to auscultation without rales, wheezing or rhonchi  ABDOMEN: Soft, non-tender, non-distended MUSCULOSKELETAL:  No edema; No deformity  SKIN: Warm and dry LOWER EXTREMITIES: no swelling NEUROLOGIC:  Alert and oriented x 3 PSYCHIATRIC:  Normal affect   ASSESSMENT:    1. Paroxysmal atrial fibrillation (HCC)   2. Anxiety   3. Palpitations    PLAN:    In order of problems listed above:  1. Paroxysmal atrial fibrillation only one documented episode of atrial fibrillation.  Her chads 2 Vascor equals 1.  There is no need to anticoagulate.  Recent zio patch showed no evidence of atrial fibrillation. 2. Palpitations nonsustained supraventricular tachycardia as well as extrasystole.  Will increase dose of Toprol-XL to 50 mg daily. 3. Anxiety seems to be doing well hopefully increased dose of beta-blocker will help as well.   Medication Adjustments/Labs and Tests Ordered: Current medicines are reviewed at length with the patient today.  Concerns regarding medicines are outlined above.  No orders of the defined types were placed in this encounter.  Medication changes: No orders of the defined types were placed in this encounter.   Signed, Georgeanna Lea, MD, [facility] 04/02/2018 4:40 PM    [facility]

## 2018-07-10 ENCOUNTER — Ambulatory Visit: Payer: 59 | Admitting: Cardiology

## 2018-07-10 ENCOUNTER — Encounter: Payer: Self-pay | Admitting: Cardiology

## 2018-07-10 VITALS — BP 104/62 | HR 56 | Wt 164.0 lb

## 2018-07-10 DIAGNOSIS — F419 Anxiety disorder, unspecified: Secondary | ICD-10-CM | POA: Diagnosis not present

## 2018-07-10 DIAGNOSIS — I480 Paroxysmal atrial fibrillation: Secondary | ICD-10-CM | POA: Diagnosis not present

## 2018-07-10 DIAGNOSIS — R002 Palpitations: Secondary | ICD-10-CM

## 2018-07-10 NOTE — Progress Notes (Signed)
Cardiology Office Note:    Date:  07/10/2018   ID:  Sonya Arellano, DOB 07/23/1971, MRN [id number]  PCP:  Marylen PontoHolt, Lynley S, MD  Cardiologist:  Gypsy Balsamobert Starlena Beil, MD    Referring MD: Marylen PontoHolt, Lynley S, MD   Chief Complaint  Patient presents with  . Follow-up  Doing well  History of Present Illness:    Sonya Arellano is a 47 y.o. female with palpitations.  Small dose of beta-blocker Toprol-XL 50 mg make her feel good denies have any chest pain tightness squeezing pressure burning chest very rare skipped beats but happy the way she feels denies having sustained arrhythmia he goes to gym on the regular basis and enjoying it  Past Medical History:  Diagnosis Date  . Anxiety 02/21/2018  . Atrial fibrillation (HCC) 02/21/2018  . Graves' disease   . Hypothyroidism 02/21/2018  . Toxic goiter     Past Surgical History:  Procedure Laterality Date  . CESAREAN SECTION    . Radioactive Iodine Ablation      Current Medications: Current Meds  Medication Sig  . atorvastatin (LIPITOR) 10 MG tablet Take 10 mg by mouth daily.  Marland Kitchen. levothyroxine (SYNTHROID, LEVOTHROID) 75 MCG tablet Take 1 tablet by mouth daily.  Marland Kitchen. LORazepam (ATIVAN) 0.5 MG tablet Take 1 tablet by mouth as needed.  . metoprolol succinate (TOPROL-XL) 50 MG 24 hr tablet Take 1 tablet (50 mg total) by mouth daily. Take with or immediately following a meal.  . vortioxetine HBr (TRINTELLIX) 20 MG TABS tablet Take 20 mg by mouth daily.      Allergies:   Sulfa antibiotics; Sulfonamide derivatives; and Ketoconazole   Social History   Socioeconomic History  . Marital status: Married    Spouse name: Not on file  . Number of children: Not on file  . Years of education: Not on file  . Highest education level: Not on file  Occupational History  . Not on file  Social Needs  . Financial resource strain: Not on file  . Food insecurity:    Worry: Not on file    Inability: Not on file  . Transportation needs:    Medical: Not on file   Non-medical: Not on file  Tobacco Use  . Smoking status: Former Games developermoker  . Smokeless tobacco: Never Used  Substance and Sexual Activity  . Alcohol use: Yes    Alcohol/week: 1.0 - 2.0 standard drinks    Types: 1 - 2 Cans of beer per week    Comment: 2-3 x per week  . Drug use: Not on file  . Sexual activity: Not on file  Lifestyle  . Physical activity:    Days per week: Not on file    Minutes per session: Not on file  . Stress: Not on file  Relationships  . Social connections:    Talks on phone: Not on file    Gets together: Not on file    Attends religious service: Not on file    Active member of club or organization: Not on file    Attends meetings of clubs or organizations: Not on file    Relationship status: Not on file  Other Topics Concern  . Not on file  Social History Narrative  . Not on file     Family History: The patient's family history includes Aneurysm in her father; Breast cancer in her maternal grandmother; Heart disease in her father, maternal grandfather, paternal grandfather, and paternal grandmother; Osteoarthritis in her mother; Osteoporosis in her mother; Stroke  in her maternal grandmother. ROS:   Please see the history of present illness.    All 14 point review of systems negative except as described per history of present illness  EKGs/Labs/Other Studies Reviewed:      Recent Labs: No results found for requested labs within last 8760 hours.  Recent Lipid Panel No results found for: CHOL, TRIG, HDL, CHOLHDL, VLDL, LDLCALC, LDLDIRECT  Physical Exam:    VS:  BP 104/62   Pulse (!) 56   Wt 164 lb (74.4 kg)   SpO2 98%   BMI 27.29 kg/m     Wt Readings from Last 3 Encounters:  07/10/18 164 lb (74.4 kg)  04/02/18 173 lb 6.4 oz (78.7 kg)  02/23/18 175 lb (79.4 kg)     GEN:  Well nourished, well developed in no acute distress HEENT: Normal NECK: No JVD; No carotid bruits LYMPHATICS: No lymphadenopathy CARDIAC: RRR, no murmurs, no rubs, no  gallops RESPIRATORY:  Clear to auscultation without rales, wheezing or rhonchi  ABDOMEN: Soft, non-tender, non-distended MUSCULOSKELETAL:  No edema; No deformity  SKIN: Warm and dry LOWER EXTREMITIES: no swelling NEUROLOGIC:  Alert and oriented x 3 PSYCHIATRIC:  Normal affect   ASSESSMENT:    1. Palpitations   2. Anxiety   3. Paroxysmal atrial fibrillation (HCC)    PLAN:    In order of problems listed above:  1. Palpitations: Doing well from that point review happy with the way she feels 2. Anxiety.  Stable. 3. Paroxysmal atrial fibrillation only one documented episode when she got hyperthyroidism not anymore.  30 days Holter monitor did not show any evidence of atrial fibrillation.  Not anticoagulated 4. Dyslipidemia on Lipitor which I will continue    Medication Adjustments/Labs and Tests Ordered: Current medicines are reviewed at length with the patient today.  Concerns regarding medicines are outlined above.  No orders of the defined types were placed in this encounter.  Medication changes: No orders of the defined types were placed in this encounter.   Signed, Georgeanna Lea, MD, [facility] 07/10/2018 4:20 PM    [facility]

## 2018-07-10 NOTE — Patient Instructions (Signed)

## 2019-04-08 ENCOUNTER — Emergency Department: Payer: 59

## 2019-04-08 ENCOUNTER — Encounter: Payer: Self-pay | Admitting: Emergency Medicine

## 2019-04-08 ENCOUNTER — Other Ambulatory Visit: Payer: Self-pay

## 2019-04-08 ENCOUNTER — Emergency Department
Admission: EM | Admit: 2019-04-08 | Discharge: 2019-04-08 | Disposition: A | Discharge status: Home / Self Care | Payer: 59 | Attending: Emergency Medicine | Admitting: Emergency Medicine

## 2019-04-08 DIAGNOSIS — R079 Chest pain, unspecified: Secondary | ICD-10-CM | POA: Diagnosis present

## 2019-04-08 DIAGNOSIS — Z5321 Procedure and treatment not carried out due to patient leaving prior to being seen by health care provider: Secondary | ICD-10-CM | POA: Diagnosis not present

## 2019-04-08 LAB — CBC
HCT: 39.4 % (ref 36.0–46.0)
Hemoglobin: 12.9 g/dL (ref 12.0–15.0)
MCH: 28.8 pg (ref 26.0–34.0)
MCHC: 32.7 g/dL (ref 30.0–36.0)
MCV: 87.9 fL (ref 80.0–100.0)
Platelets: 373 10*3/uL (ref 150–400)
RBC: 4.48 MIL/uL (ref 3.87–5.11)
RDW: 12.6 % (ref 11.5–15.5)
WBC: 7.9 10*3/uL (ref 4.0–10.5)
nRBC: 0 % (ref 0.0–0.2)

## 2019-04-08 LAB — BASIC METABOLIC PANEL
Anion gap: 8 (ref 5–15)
BUN: 11 mg/dL (ref 6–20)
CO2: 27 mmol/L (ref 22–32)
Calcium: 8.8 mg/dL — ABNORMAL LOW (ref 8.9–10.3)
Chloride: 103 mmol/L (ref 98–111)
Creatinine, Ser: 0.9 mg/dL (ref 0.44–1.00)
GFR calc Af Amer: >60 mL/min (ref >60)
GFR calc non Af Amer: >60 mL/min (ref >60)
Glucose, Bld: 122 mg/dL — ABNORMAL HIGH (ref 70–99)
Potassium: 3.5 mmol/L (ref 3.5–5.1)
Sodium: 138 mmol/L (ref 135–145)

## 2019-04-08 LAB — TROPONIN I (HIGH SENSITIVITY): Troponin I (High Sensitivity): <2 ng/L (ref <18)

## 2019-04-08 NOTE — ED Notes (Signed)
No answer when called several times from lobby or outside

## 2019-04-08 NOTE — ED Triage Notes (Signed)
Pt reports acute onset 15 minutes ago of chest pain under both breasts up into center chest and back.  Felt like could not get a good breath when pain came as well as having nausea.  All sx have eased and pain currently 3/10.  No fever or cough recently.  Unlabored in triage.  NAD.  VSS in triage.  Episode lasted about 5-10 minutes.

## 2019-04-08 NOTE — ED Notes (Signed)
No answer when called several times from lobby and outside ?

## 2019-04-08 NOTE — ED Notes (Signed)
No answer when called several times from lobby & outside 

## 2019-04-09 ENCOUNTER — Telehealth: Payer: Self-pay | Admitting: Emergency Medicine

## 2019-04-09 NOTE — Telephone Encounter (Signed)
Called patient due to lwot to inquire about condition and follow up plans. She still has some soreness in her chest.  She agrees to call her doctor and describe her symptoms.

## 2019-08-22 ENCOUNTER — Telehealth: Payer: Self-pay | Admitting: Cardiology

## 2019-08-22 NOTE — Telephone Encounter (Signed)
Last office visit note has been faxed.

## 2019-08-22 NOTE — Telephone Encounter (Signed)
New Message    Lupita Leash is calling from Dr Guy Begin office and is requesting the records from the pts last visit with Dr Bing Matter  She says the pt is scheduled for a procedure     Please advise

## 2019-09-05 ENCOUNTER — Telehealth: Payer: Self-pay | Admitting: Cardiology

## 2019-09-05 NOTE — Telephone Encounter (Signed)
Nurse from [facility] is requesting office notes from 07/10/2018 appt with Dr. Bing Matter, echo and EKG from 03/02/2018 to be faxed to [facility], she is to send over a request for medical records.   [phone]

## 2019-09-13 DIAGNOSIS — K8010 Calculus of gallbladder with chronic cholecystitis without obstruction: Secondary | ICD-10-CM

## 2019-12-30 ENCOUNTER — Encounter: Payer: Self-pay | Admitting: Gastroenterology

## 2020-09-08 DIAGNOSIS — Z01419 Encounter for gynecological examination (general) (routine) without abnormal findings: Secondary | ICD-10-CM | POA: Diagnosis not present

## 2020-09-08 DIAGNOSIS — Z130 Encounter for screening for diseases of the blood and blood-forming organs and certain disorders involving the immune mechanism: Secondary | ICD-10-CM | POA: Diagnosis not present

## 2020-09-08 DIAGNOSIS — Z6831 Body mass index (BMI) 31.0-31.9, adult: Secondary | ICD-10-CM | POA: Diagnosis not present

## 2020-09-08 DIAGNOSIS — D649 Anemia, unspecified: Secondary | ICD-10-CM | POA: Diagnosis not present

## 2020-09-08 DIAGNOSIS — Z1231 Encounter for screening mammogram for malignant neoplasm of breast: Secondary | ICD-10-CM | POA: Diagnosis not present

## 2020-09-15 DIAGNOSIS — D649 Anemia, unspecified: Secondary | ICD-10-CM | POA: Diagnosis not present

## 2020-09-15 DIAGNOSIS — E7800 Pure hypercholesterolemia, unspecified: Secondary | ICD-10-CM | POA: Diagnosis not present

## 2020-09-15 DIAGNOSIS — F410 Panic disorder [episodic paroxysmal anxiety] without agoraphobia: Secondary | ICD-10-CM | POA: Diagnosis not present

## 2020-09-15 DIAGNOSIS — E039 Hypothyroidism, unspecified: Secondary | ICD-10-CM | POA: Diagnosis not present

## 2020-09-15 DIAGNOSIS — Z6831 Body mass index (BMI) 31.0-31.9, adult: Secondary | ICD-10-CM | POA: Diagnosis not present

## 2020-09-17 ENCOUNTER — Other Ambulatory Visit: Payer: Self-pay | Admitting: Gynecology

## 2020-09-17 DIAGNOSIS — R928 Other abnormal and inconclusive findings on diagnostic imaging of breast: Secondary | ICD-10-CM

## 2020-10-02 ENCOUNTER — Ambulatory Visit
Admission: RE | Admit: 2020-10-02 | Discharge: 2020-10-02 | Disposition: A | Discharge status: Home / Self Care | Payer: 59 | Source: Ambulatory Visit | Attending: Gynecology | Admitting: Gynecology

## 2020-10-02 ENCOUNTER — Other Ambulatory Visit: Payer: Self-pay | Admitting: Gynecology

## 2020-10-02 ENCOUNTER — Other Ambulatory Visit: Payer: Self-pay

## 2020-10-02 DIAGNOSIS — R928 Other abnormal and inconclusive findings on diagnostic imaging of breast: Secondary | ICD-10-CM

## 2020-10-02 DIAGNOSIS — N6324 Unspecified lump in the left breast, lower inner quadrant: Secondary | ICD-10-CM | POA: Diagnosis not present

## 2020-10-02 DIAGNOSIS — N63 Unspecified lump in unspecified breast: Secondary | ICD-10-CM

## 2020-10-02 DIAGNOSIS — N6311 Unspecified lump in the right breast, upper outer quadrant: Secondary | ICD-10-CM | POA: Diagnosis not present

## 2020-10-02 DIAGNOSIS — N6322 Unspecified lump in the left breast, upper inner quadrant: Secondary | ICD-10-CM | POA: Diagnosis not present

## 2020-10-02 DIAGNOSIS — R922 Inconclusive mammogram: Secondary | ICD-10-CM | POA: Diagnosis not present

## 2020-10-02 DIAGNOSIS — R921 Mammographic calcification found on diagnostic imaging of breast: Secondary | ICD-10-CM

## 2020-10-02 DIAGNOSIS — N6313 Unspecified lump in the right breast, lower outer quadrant: Secondary | ICD-10-CM | POA: Diagnosis not present

## 2021-03-05 DIAGNOSIS — J329 Chronic sinusitis, unspecified: Secondary | ICD-10-CM | POA: Diagnosis not present

## 2021-03-05 DIAGNOSIS — F410 Panic disorder [episodic paroxysmal anxiety] without agoraphobia: Secondary | ICD-10-CM | POA: Diagnosis not present

## 2021-03-05 DIAGNOSIS — Z1331 Encounter for screening for depression: Secondary | ICD-10-CM | POA: Diagnosis not present

## 2021-03-05 DIAGNOSIS — Z6832 Body mass index (BMI) 32.0-32.9, adult: Secondary | ICD-10-CM | POA: Diagnosis not present

## 2021-04-06 ENCOUNTER — Ambulatory Visit
Admission: RE | Admit: 2021-04-06 | Discharge: 2021-04-06 | Disposition: A | Discharge status: Home / Self Care | Payer: BC Managed Care – PPO | Source: Ambulatory Visit | Attending: Gynecology | Admitting: Gynecology

## 2021-04-06 ENCOUNTER — Other Ambulatory Visit: Payer: Self-pay

## 2021-04-06 ENCOUNTER — Other Ambulatory Visit: Payer: Self-pay | Admitting: Gynecology

## 2021-04-06 DIAGNOSIS — R921 Mammographic calcification found on diagnostic imaging of breast: Secondary | ICD-10-CM

## 2021-04-06 DIAGNOSIS — N6311 Unspecified lump in the right breast, upper outer quadrant: Secondary | ICD-10-CM | POA: Diagnosis not present

## 2021-04-06 DIAGNOSIS — N63 Unspecified lump in unspecified breast: Secondary | ICD-10-CM

## 2021-04-06 DIAGNOSIS — N6313 Unspecified lump in the right breast, lower outer quadrant: Secondary | ICD-10-CM | POA: Diagnosis not present

## 2021-04-06 DIAGNOSIS — R922 Inconclusive mammogram: Secondary | ICD-10-CM | POA: Diagnosis not present

## 2021-07-05 DIAGNOSIS — L308 Other specified dermatitis: Secondary | ICD-10-CM | POA: Diagnosis not present

## 2021-07-05 DIAGNOSIS — L298 Other pruritus: Secondary | ICD-10-CM | POA: Diagnosis not present

## 2021-08-31 DIAGNOSIS — E039 Hypothyroidism, unspecified: Secondary | ICD-10-CM | POA: Diagnosis not present

## 2021-08-31 DIAGNOSIS — F410 Panic disorder [episodic paroxysmal anxiety] without agoraphobia: Secondary | ICD-10-CM | POA: Diagnosis not present

## 2021-08-31 DIAGNOSIS — Z6832 Body mass index (BMI) 32.0-32.9, adult: Secondary | ICD-10-CM | POA: Diagnosis not present

## 2021-08-31 DIAGNOSIS — E7800 Pure hypercholesterolemia, unspecified: Secondary | ICD-10-CM | POA: Diagnosis not present

## 2021-10-05 ENCOUNTER — Ambulatory Visit
Admission: RE | Admit: 2021-10-05 | Discharge: 2021-10-05 | Disposition: A | Discharge status: Home / Self Care | Payer: BC Managed Care – PPO | Source: Ambulatory Visit | Attending: Gynecology | Admitting: Gynecology

## 2021-10-05 DIAGNOSIS — R921 Mammographic calcification found on diagnostic imaging of breast: Secondary | ICD-10-CM

## 2021-10-05 DIAGNOSIS — N63 Unspecified lump in unspecified breast: Secondary | ICD-10-CM

## 2021-10-05 DIAGNOSIS — R922 Inconclusive mammogram: Secondary | ICD-10-CM | POA: Diagnosis not present

## 2021-10-21 IMAGING — MG DIGITAL DIAGNOSTIC BILAT W/ TOMO W/ CAD
8 series · 8 of 20 positions shown · non-contrast
Comparison: Previous exams including recent screening mammogram
dated 09/08/2020.

CLINICAL DATA: Patient returns today to evaluate a possible RIGHT
breast mass and LEFT breast calcifications.

EXAM:
DIGITAL DIAGNOSTIC BILATERAL MAMMOGRAM WITH TOMOSYNTHESIS AND CAD;
ULTRASOUND LEFT BREAST LIMITED; ULTRASOUND RIGHT BREAST LIMITED
TECHNIQUE: Bilateral digital diagnostic mammography and breast tomosynthesis
was performed. The images were evaluated with computer-aided
detection.; Targeted ultrasound examination of the left breast was
performed; Targeted ultrasound examination of the right breast was
performed

[L ML]
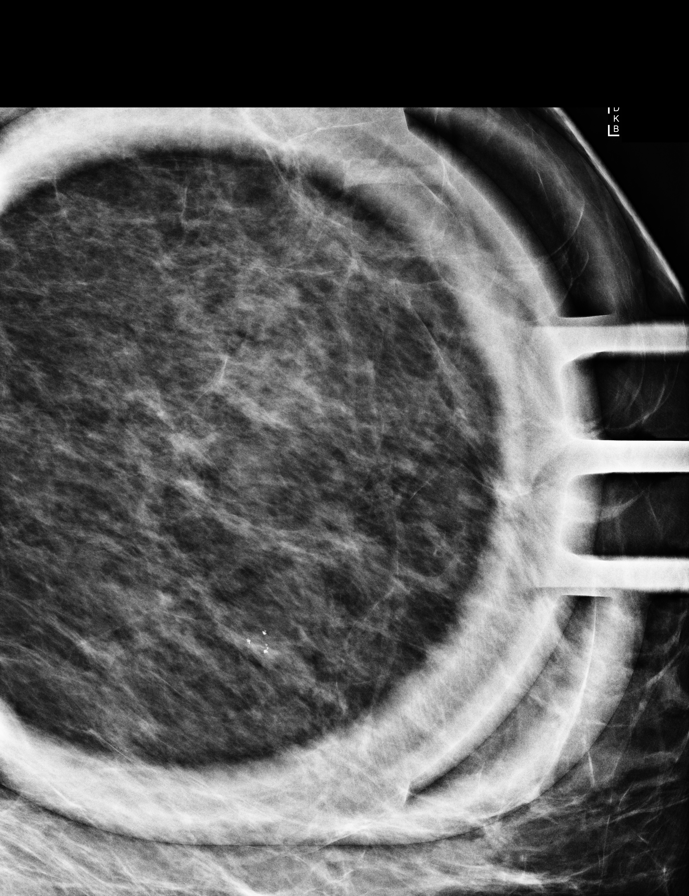

[L CC]
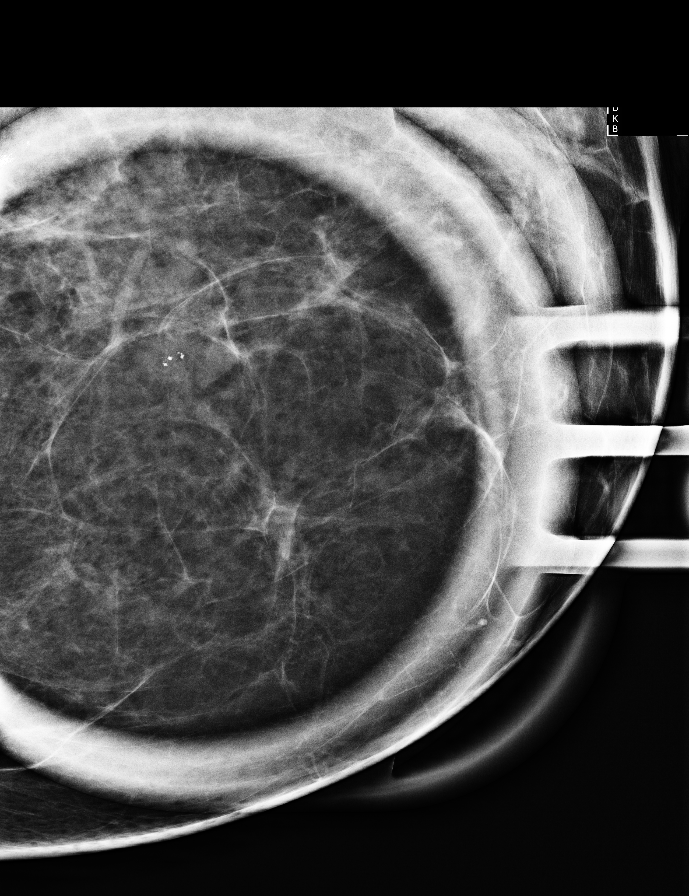

[R CC synth-2D]
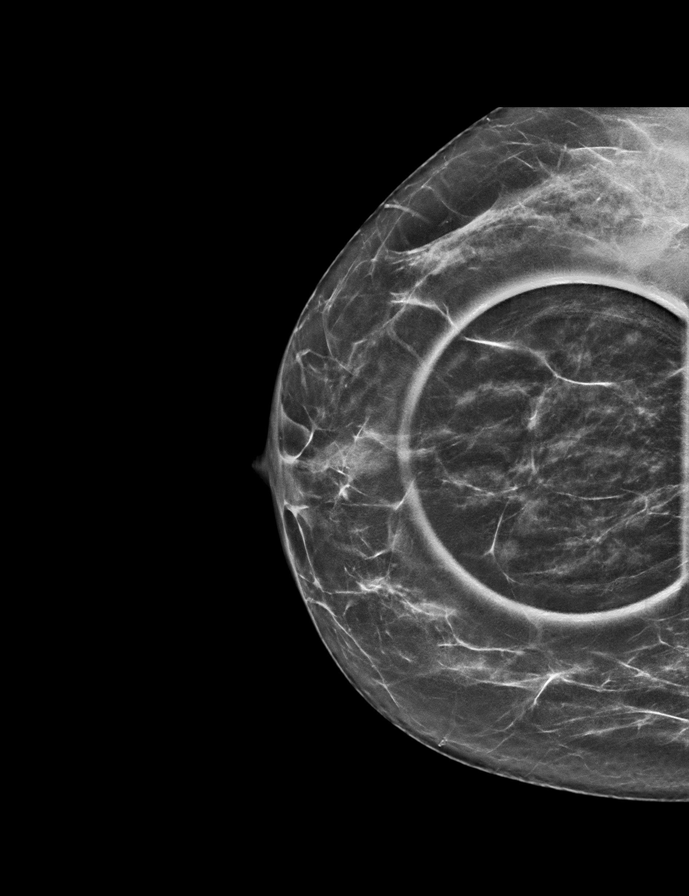

[L ML synth-2D]
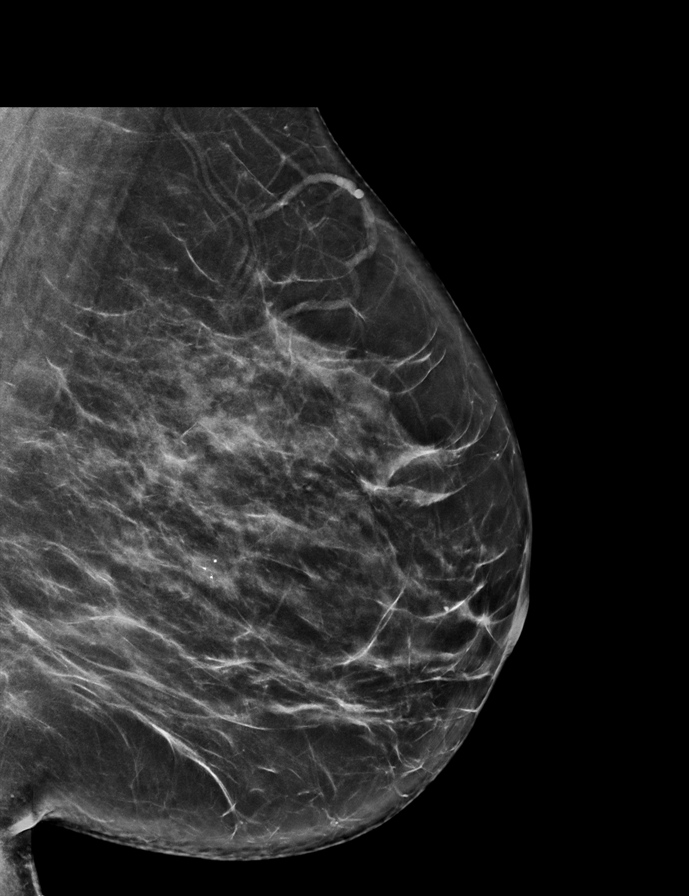

[R MLO synth-2D]
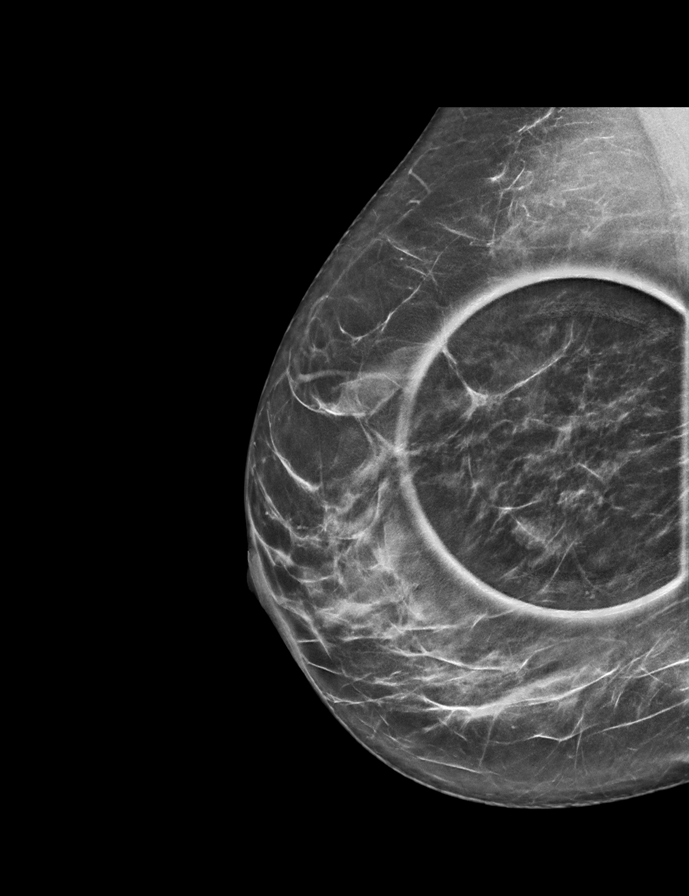

[L ML tomo · tomo slice 37/72.0]
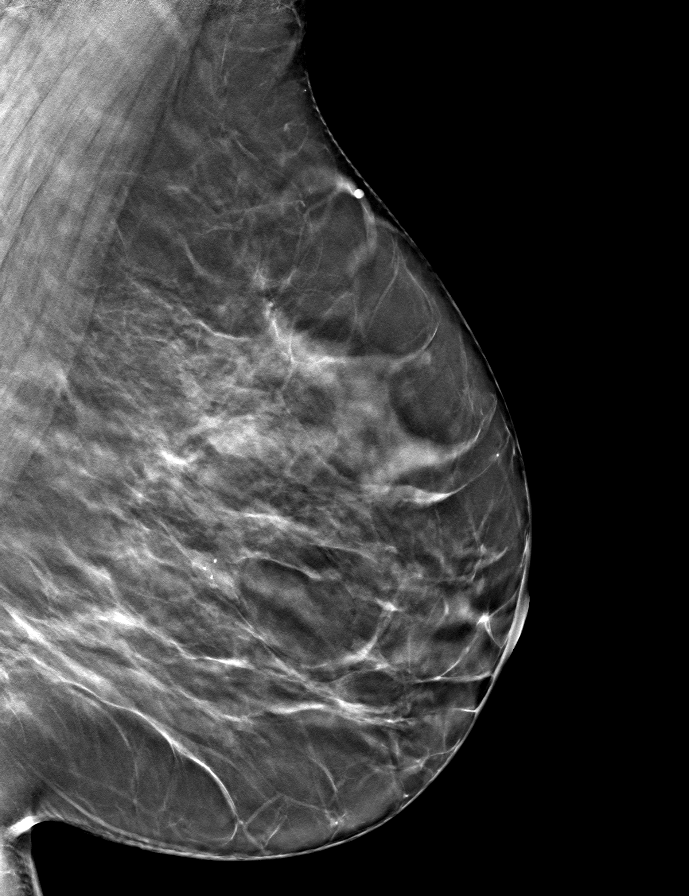

[R CC tomo · tomo slice 30/59.0]
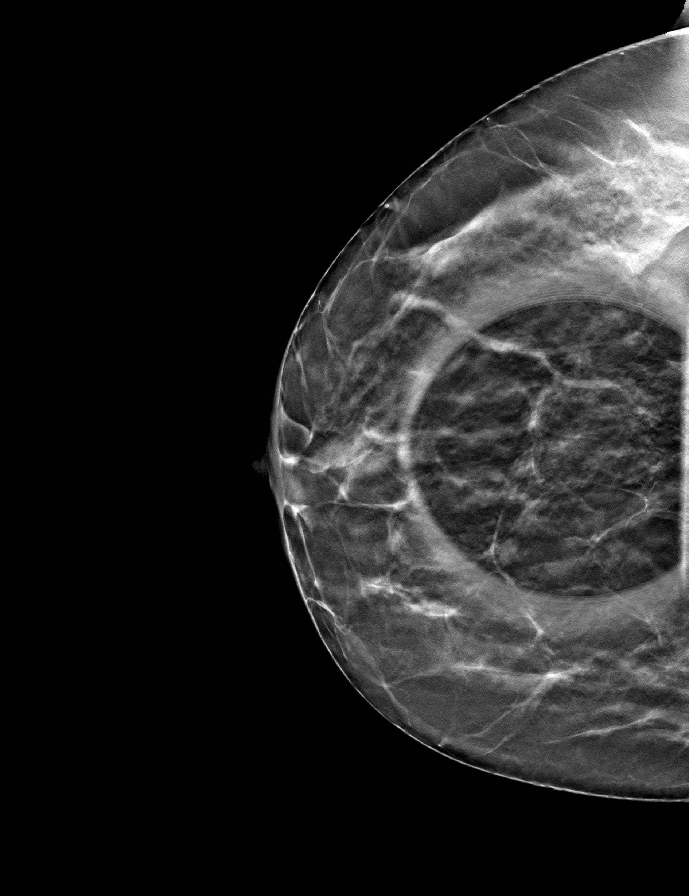

[R MLO tomo · tomo slice 32/63.0]
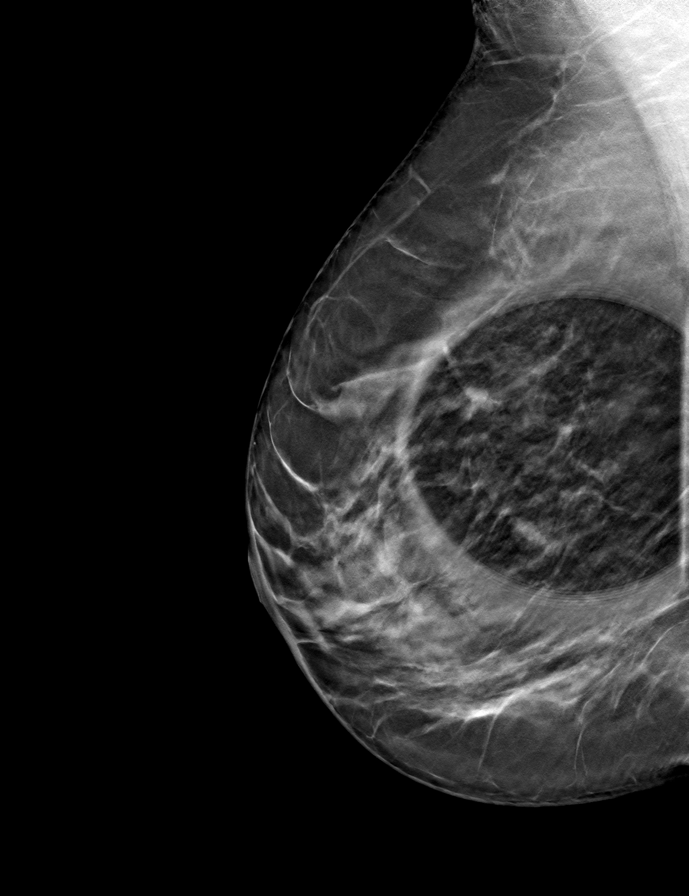

[8 of 20 positions shown; findings below may reference images not displayed]

ACR Breast Density Category c: The breast tissue is heterogeneously
dense, which may obscure small masses.
FINDINGS: Bilateral diagnostic mammogram:

RIGHT breast: On today's additional diagnostic views, an oval
circumscribed mass is confirmed within the slightly outer RIGHT
breast, measuring approximately 1 cm greatest dimension.

LEFT breast: On today's additional diagnostic views, a partially
obscured mass with associated coarse calcifications is confirmed
within the slightly inner LEFT breast, measuring approximately
cm greatest dimension.

RIGHT breast: Targeted ultrasound is performed, showing an oval
circumscribed nearly anechoic mass in the retroareolar RIGHT breast,
9 o'clock axis, measuring 1 x 0.6 x 1 cm, without internal
vascularity, most suggestive of a benign cyst during real-time
ultrasound evaluation, corresponding to the mammographic finding.

LEFT breast: Targeted ultrasound is performed, showing an oval
circumscribed hypoechoic mass in the LEFT breast at the 9 o'clock
axis, 1 cm from the nipple, measuring 1.3 x 0.5 x 1 cm, with
associated coarse calcifications, most suggestive of a benign
fibroadenoma, corresponding to the mammographic finding.
IMPRESSION: 1. Probably benign cyst in the retroareolar RIGHT breast, 9 o'clock
axis, measuring 1 x 0.6 x 1 cm, corresponding to the mammographic
finding. Recommend follow-up RIGHT breast ultrasound in 6 months to
ensure stability.
2. Probably benign fibroadenoma in the LEFT breast at the 9 o'clock
axis, 1 cm from the nipple, measuring 1.3 x 0.5 x 1 cm, with
associated coarse calcifications, corresponding to the mammographic
finding. Recommend follow-up LEFT breast diagnostic mammogram and
possible ultrasound in 6 months to ensure stability.

RECOMMENDATION:
1. RIGHT breast ultrasound in 6 months.
2. LEFT breast diagnostic mammogram, and possible ultrasound, in 6
months.

I have discussed the findings and recommendations with the patient.
If applicable, a reminder letter will be sent to the patient
regarding the next appointment.

BI-RADS CATEGORY  3: Probably benign.

## 2021-10-21 IMAGING — US US BREAST*L* LIMITED INC AXILLA
1 series · 6 of 6 positions shown · non-contrast
Comparison: Previous exams including recent screening mammogram
dated 09/08/2020.

CLINICAL DATA: Patient returns today to evaluate a possible RIGHT
breast mass and LEFT breast calcifications.

EXAM:
DIGITAL DIAGNOSTIC BILATERAL MAMMOGRAM WITH TOMOSYNTHESIS AND CAD;
ULTRASOUND LEFT BREAST LIMITED; ULTRASOUND RIGHT BREAST LIMITED
TECHNIQUE: Bilateral digital diagnostic mammography and breast tomosynthesis
was performed. The images were evaluated with computer-aided
detection.; Targeted ultrasound examination of the left breast was
performed; Targeted ultrasound examination of the right breast was
performed

[Series 1: us breast*left* limited inc axilla · 0.07mm/px · 6 of 6 slices shown]
[im 1/6]
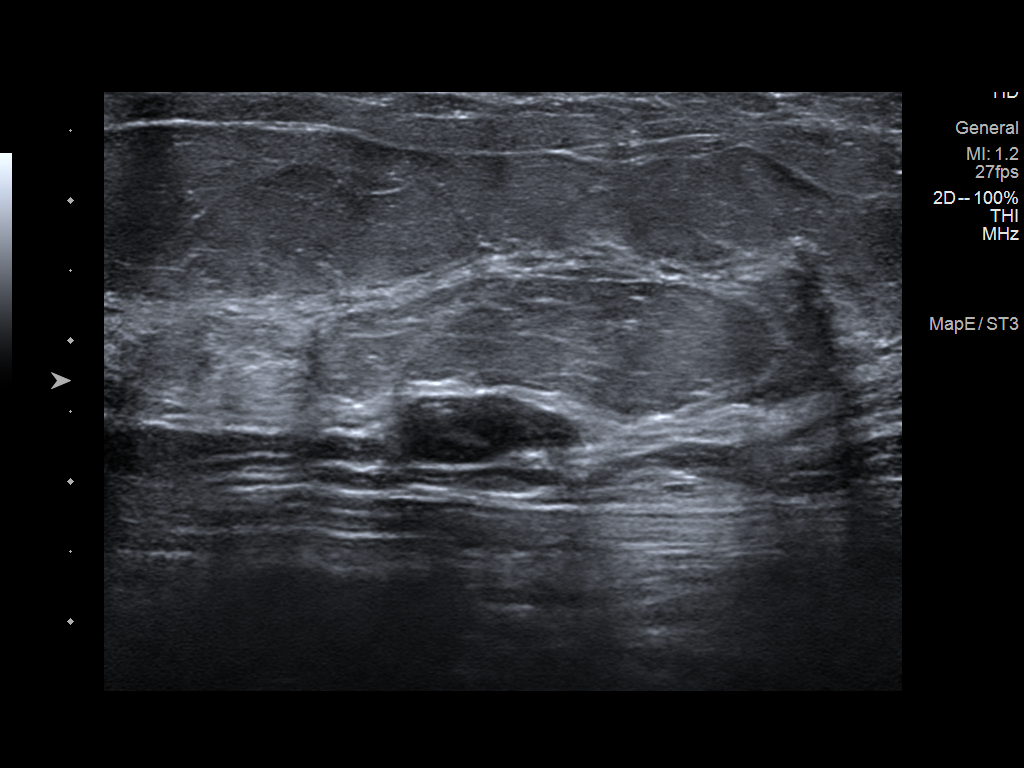
[im 2/6]
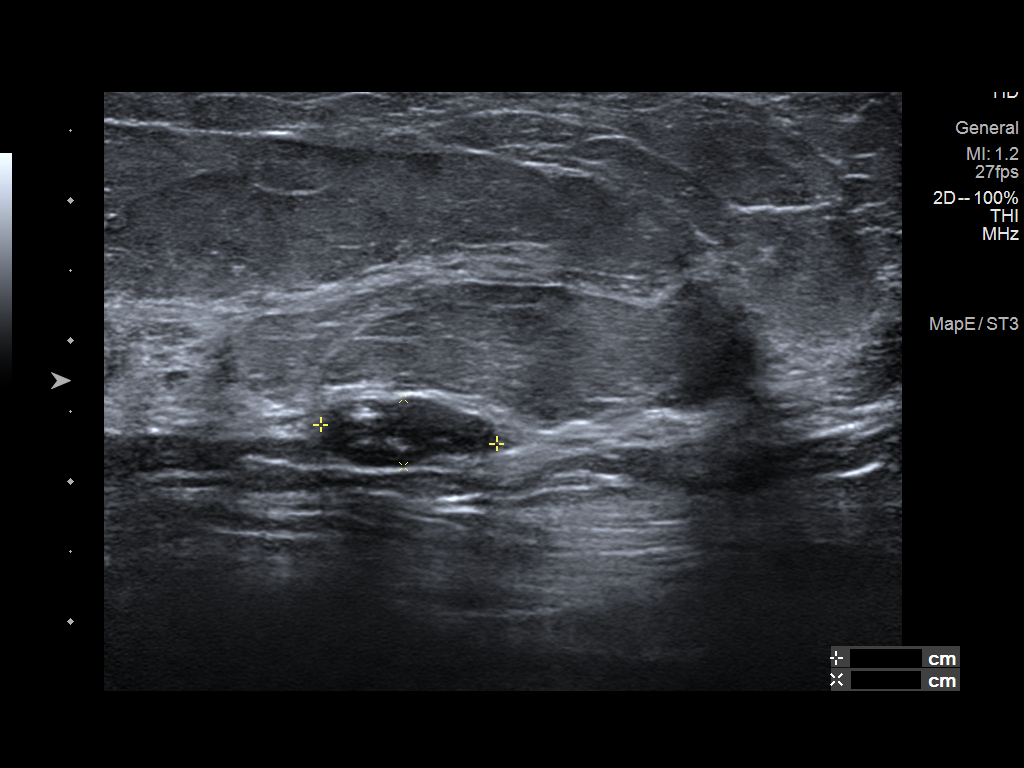
[im 3/6]
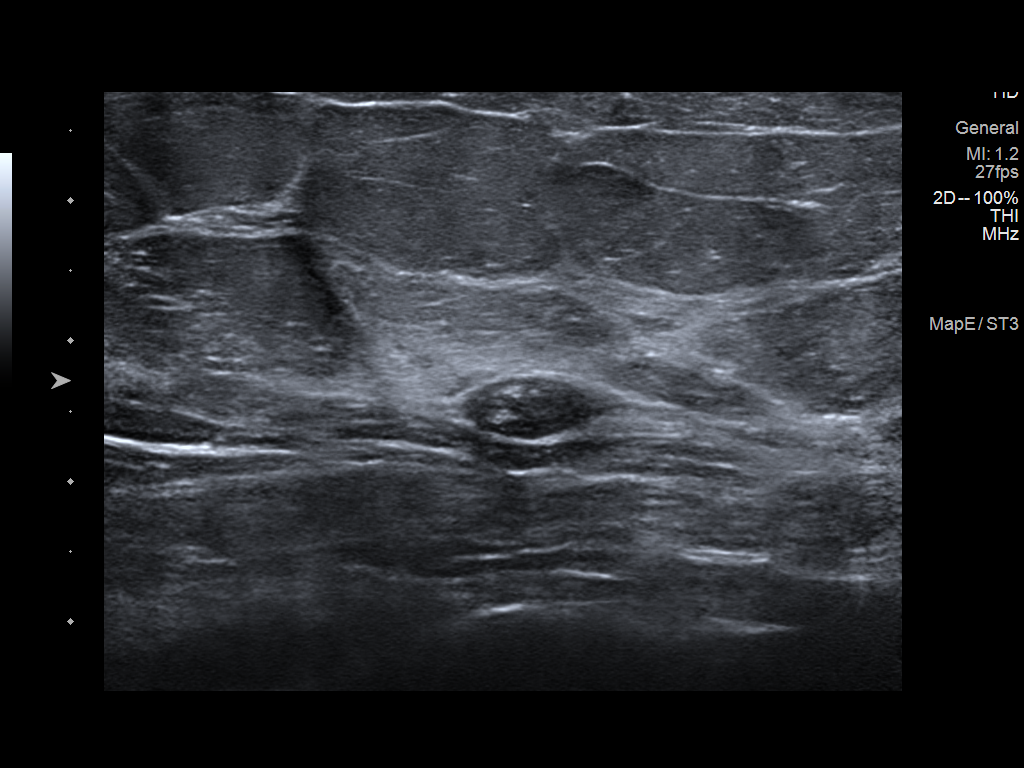
[im 4/6]
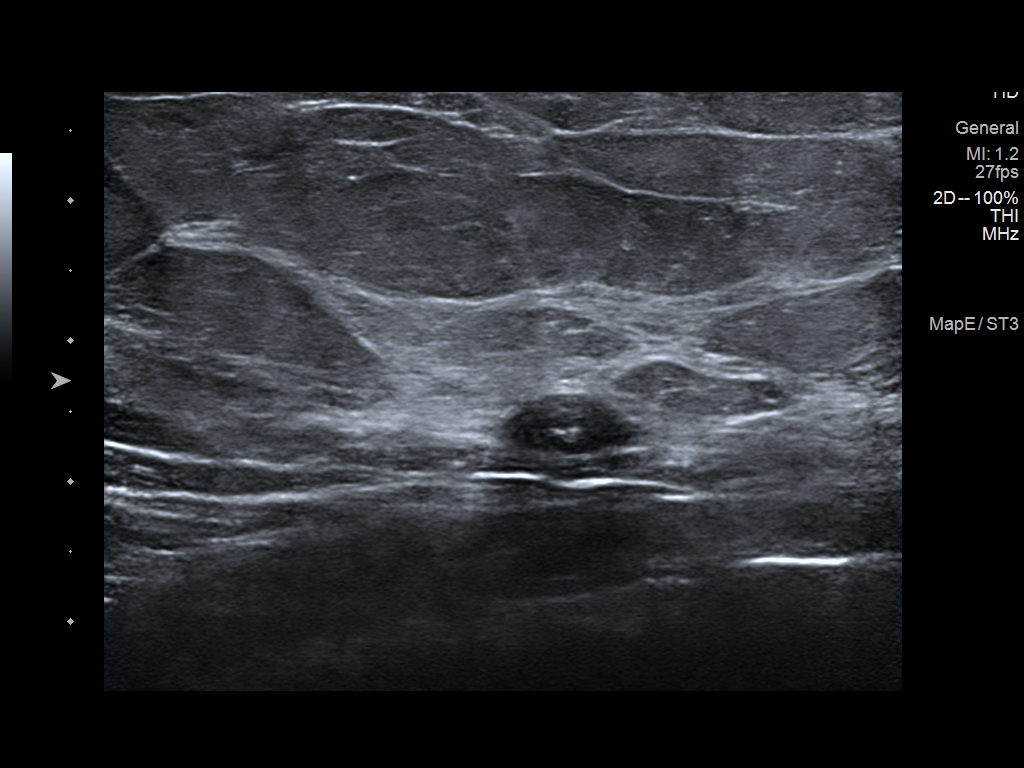
[im 5/6]
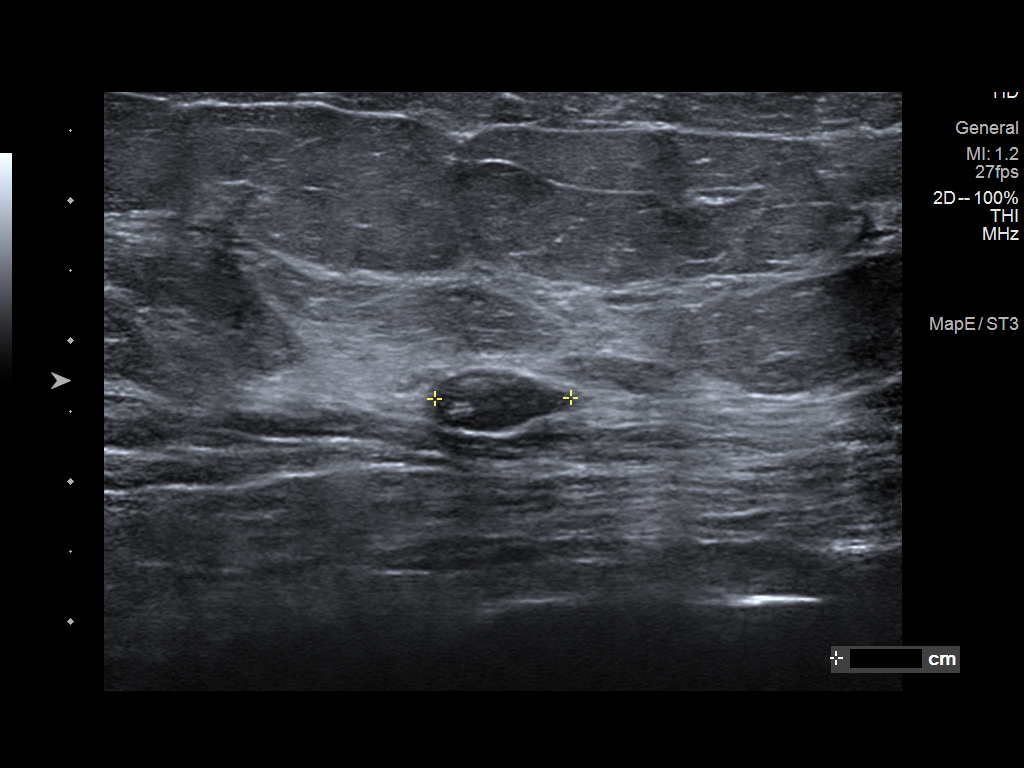
[im 6/6]
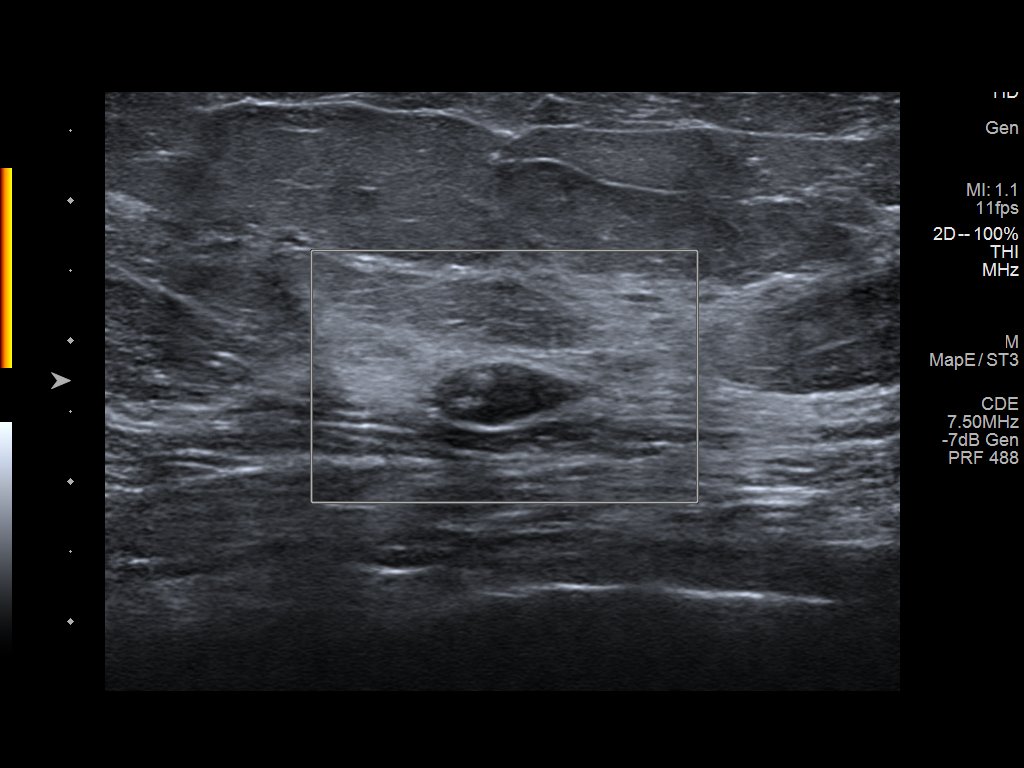

[6 of 6 positions shown; findings below may reference images not displayed]

ACR Breast Density Category c: The breast tissue is heterogeneously
dense, which may obscure small masses.
FINDINGS: Bilateral diagnostic mammogram:

RIGHT breast: On today's additional diagnostic views, an oval
circumscribed mass is confirmed within the slightly outer RIGHT
breast, measuring approximately 1 cm greatest dimension.

LEFT breast: On today's additional diagnostic views, a partially
obscured mass with associated coarse calcifications is confirmed
within the slightly inner LEFT breast, measuring approximately
cm greatest dimension.

RIGHT breast: Targeted ultrasound is performed, showing an oval
circumscribed nearly anechoic mass in the retroareolar RIGHT breast,
9 o'clock axis, measuring 1 x 0.6 x 1 cm, without internal
vascularity, most suggestive of a benign cyst during real-time
ultrasound evaluation, corresponding to the mammographic finding.

LEFT breast: Targeted ultrasound is performed, showing an oval
circumscribed hypoechoic mass in the LEFT breast at the 9 o'clock
axis, 1 cm from the nipple, measuring 1.3 x 0.5 x 1 cm, with
associated coarse calcifications, most suggestive of a benign
fibroadenoma, corresponding to the mammographic finding.
IMPRESSION: 1. Probably benign cyst in the retroareolar RIGHT breast, 9 o'clock
axis, measuring 1 x 0.6 x 1 cm, corresponding to the mammographic
finding. Recommend follow-up RIGHT breast ultrasound in 6 months to
ensure stability.
2. Probably benign fibroadenoma in the LEFT breast at the 9 o'clock
axis, 1 cm from the nipple, measuring 1.3 x 0.5 x 1 cm, with
associated coarse calcifications, corresponding to the mammographic
finding. Recommend follow-up LEFT breast diagnostic mammogram and
possible ultrasound in 6 months to ensure stability.

RECOMMENDATION:
1. RIGHT breast ultrasound in 6 months.
2. LEFT breast diagnostic mammogram, and possible ultrasound, in 6
months.

I have discussed the findings and recommendations with the patient.
If applicable, a reminder letter will be sent to the patient
regarding the next appointment.

BI-RADS CATEGORY  3: Probably benign.

## 2021-11-23 DIAGNOSIS — L719 Rosacea, unspecified: Secondary | ICD-10-CM | POA: Diagnosis not present

## 2021-11-23 DIAGNOSIS — L301 Dyshidrosis [pompholyx]: Secondary | ICD-10-CM | POA: Diagnosis not present

## 2022-01-19 DIAGNOSIS — L719 Rosacea, unspecified: Secondary | ICD-10-CM | POA: Diagnosis not present

## 2022-01-19 DIAGNOSIS — L301 Dyshidrosis [pompholyx]: Secondary | ICD-10-CM | POA: Diagnosis not present

## 2022-03-10 DIAGNOSIS — Z1331 Encounter for screening for depression: Secondary | ICD-10-CM | POA: Diagnosis not present

## 2022-03-10 DIAGNOSIS — F410 Panic disorder [episodic paroxysmal anxiety] without agoraphobia: Secondary | ICD-10-CM | POA: Diagnosis not present

## 2022-03-10 DIAGNOSIS — Z6835 Body mass index (BMI) 35.0-35.9, adult: Secondary | ICD-10-CM | POA: Diagnosis not present

## 2022-03-10 DIAGNOSIS — Z1211 Encounter for screening for malignant neoplasm of colon: Secondary | ICD-10-CM | POA: Diagnosis not present

## 2022-04-20 DIAGNOSIS — Z01818 Encounter for other preprocedural examination: Secondary | ICD-10-CM | POA: Diagnosis not present

## 2022-04-26 DIAGNOSIS — L719 Rosacea, unspecified: Secondary | ICD-10-CM | POA: Diagnosis not present

## 2022-05-09 DIAGNOSIS — Z1211 Encounter for screening for malignant neoplasm of colon: Secondary | ICD-10-CM | POA: Diagnosis not present

## 2022-05-10 DIAGNOSIS — J019 Acute sinusitis, unspecified: Secondary | ICD-10-CM | POA: Diagnosis not present

## 2022-05-10 DIAGNOSIS — J209 Acute bronchitis, unspecified: Secondary | ICD-10-CM | POA: Diagnosis not present

## 2023-01-17 ENCOUNTER — Encounter: Payer: Self-pay | Admitting: Gynecology

## 2023-01-17 ENCOUNTER — Other Ambulatory Visit: Payer: Self-pay | Admitting: Gynecology

## 2023-01-17 DIAGNOSIS — N63 Unspecified lump in unspecified breast: Secondary | ICD-10-CM

## 2023-02-07 ENCOUNTER — Ambulatory Visit
Admission: RE | Admit: 2023-02-07 | Discharge: 2023-02-07 | Disposition: A | Discharge status: Home / Self Care | Payer: BC Managed Care – PPO | Source: Ambulatory Visit | Attending: Gynecology | Admitting: Gynecology

## 2023-02-07 ENCOUNTER — Ambulatory Visit
Admission: RE | Admit: 2023-02-07 | Discharge: 2023-02-07 | Disposition: A | Discharge status: Home / Self Care | Payer: Managed Care, Other (non HMO) | Source: Ambulatory Visit | Attending: Gynecology | Admitting: Gynecology

## 2023-02-07 DIAGNOSIS — N63 Unspecified lump in unspecified breast: Secondary | ICD-10-CM
